# Patient Record
Sex: Female | Born: 1940 | Race: Black or African American | Hispanic: No | Marital: Married | Smoking: Never smoker
Health system: Southern US, Community
[De-identification: ages and names within clinical notes are randomized; demographics above are authoritative.]

## PROBLEM LIST (undated history)

## (undated) DIAGNOSIS — E039 Hypothyroidism, unspecified: Secondary | ICD-10-CM

## (undated) DIAGNOSIS — Z973 Presence of spectacles and contact lenses: Secondary | ICD-10-CM

## (undated) DIAGNOSIS — Z86711 Personal history of pulmonary embolism: Secondary | ICD-10-CM

## (undated) DIAGNOSIS — E785 Hyperlipidemia, unspecified: Secondary | ICD-10-CM

## (undated) DIAGNOSIS — I10 Essential (primary) hypertension: Secondary | ICD-10-CM

## (undated) HISTORY — DX: Essential (primary) hypertension: I10

## (undated) HISTORY — PX: ABDOMINAL HYSTERECTOMY: SHX81

## (undated) HISTORY — PX: BUNIONECTOMY: SHX129

## (undated) HISTORY — DX: Hyperlipidemia, unspecified: E78.5

## (undated) HISTORY — PX: COLONOSCOPY: SHX174

---

## 1998-01-08 ENCOUNTER — Emergency Department (HOSPITAL_COMMUNITY): Admission: EM | Admit: 1998-01-08 | Discharge: 1998-01-08 | Payer: Self-pay | Admitting: Emergency Medicine

## 1998-04-30 ENCOUNTER — Inpatient Hospital Stay (HOSPITAL_COMMUNITY): Admission: RE | Admit: 1998-04-30 | Discharge: 1998-05-03 | Payer: Self-pay | Admitting: Obstetrics and Gynecology

## 1999-12-11 ENCOUNTER — Emergency Department (HOSPITAL_COMMUNITY): Admission: EM | Admit: 1999-12-11 | Discharge: 1999-12-11 | Payer: Self-pay | Admitting: Emergency Medicine

## 1999-12-11 ENCOUNTER — Encounter: Payer: Self-pay | Admitting: Emergency Medicine

## 1999-12-26 ENCOUNTER — Inpatient Hospital Stay (HOSPITAL_COMMUNITY): Admission: EM | Admit: 1999-12-26 | Discharge: 1999-12-31 | Payer: Self-pay | Admitting: Emergency Medicine

## 1999-12-26 ENCOUNTER — Encounter: Payer: Self-pay | Admitting: Emergency Medicine

## 2001-01-05 ENCOUNTER — Encounter: Admission: RE | Admit: 2001-01-05 | Discharge: 2001-01-05 | Payer: Self-pay | Admitting: Obstetrics and Gynecology

## 2001-01-05 ENCOUNTER — Encounter: Payer: Self-pay | Admitting: Obstetrics and Gynecology

## 2002-04-15 ENCOUNTER — Encounter: Payer: Self-pay | Admitting: Obstetrics and Gynecology

## 2002-04-15 ENCOUNTER — Encounter: Admission: RE | Admit: 2002-04-15 | Discharge: 2002-04-15 | Payer: Self-pay | Admitting: Obstetrics and Gynecology

## 2003-05-08 ENCOUNTER — Encounter: Admission: RE | Admit: 2003-05-08 | Discharge: 2003-05-08 | Payer: Self-pay | Admitting: Obstetrics and Gynecology

## 2003-05-08 ENCOUNTER — Encounter: Payer: Self-pay | Admitting: Obstetrics and Gynecology

## 2004-05-20 ENCOUNTER — Ambulatory Visit (HOSPITAL_COMMUNITY): Admission: RE | Admit: 2004-05-20 | Discharge: 2004-05-20 | Payer: Self-pay | Admitting: Obstetrics and Gynecology

## 2004-06-03 ENCOUNTER — Encounter: Admission: RE | Admit: 2004-06-03 | Discharge: 2004-06-03 | Payer: Self-pay | Admitting: Obstetrics and Gynecology

## 2004-12-09 ENCOUNTER — Encounter: Admission: RE | Admit: 2004-12-09 | Discharge: 2004-12-09 | Payer: Self-pay | Admitting: Obstetrics and Gynecology

## 2005-07-25 ENCOUNTER — Encounter: Admission: RE | Admit: 2005-07-25 | Discharge: 2005-07-25 | Payer: Self-pay | Admitting: Internal Medicine

## 2006-01-27 ENCOUNTER — Ambulatory Visit: Payer: Self-pay | Admitting: Internal Medicine

## 2006-02-09 ENCOUNTER — Encounter (INDEPENDENT_AMBULATORY_CARE_PROVIDER_SITE_OTHER): Payer: Self-pay | Admitting: Specialist

## 2006-02-09 ENCOUNTER — Ambulatory Visit: Payer: Self-pay | Admitting: Internal Medicine

## 2006-08-17 ENCOUNTER — Encounter: Admission: RE | Admit: 2006-08-17 | Discharge: 2006-08-17 | Payer: Self-pay | Admitting: Obstetrics and Gynecology

## 2006-08-31 ENCOUNTER — Encounter: Admission: RE | Admit: 2006-08-31 | Discharge: 2006-08-31 | Payer: Self-pay | Admitting: Obstetrics and Gynecology

## 2007-08-21 ENCOUNTER — Encounter: Admission: RE | Admit: 2007-08-21 | Discharge: 2007-08-21 | Payer: Self-pay | Admitting: Internal Medicine

## 2008-04-25 IMAGING — MG MM DIGITAL SCREENING BILAT W/ CAD
4 series · 4 of 4 positions shown · non-contrast
Comparison: none

DG SCREEN MAMMOGRAM BILATERAL
Bilateral CC and MLO view(s) were taken.

DIGITAL SCREENING MAMMOGRAM WITH CAD:
There is a fibroglandular pattern.  A possible mass is noted in the left breast.  Spot compression 
views and possibly sonography are recommended for further evaluation.  In the right breast, no 
masses or malignant type calcifications are identified.  Compared with prior studies.

[R CC]
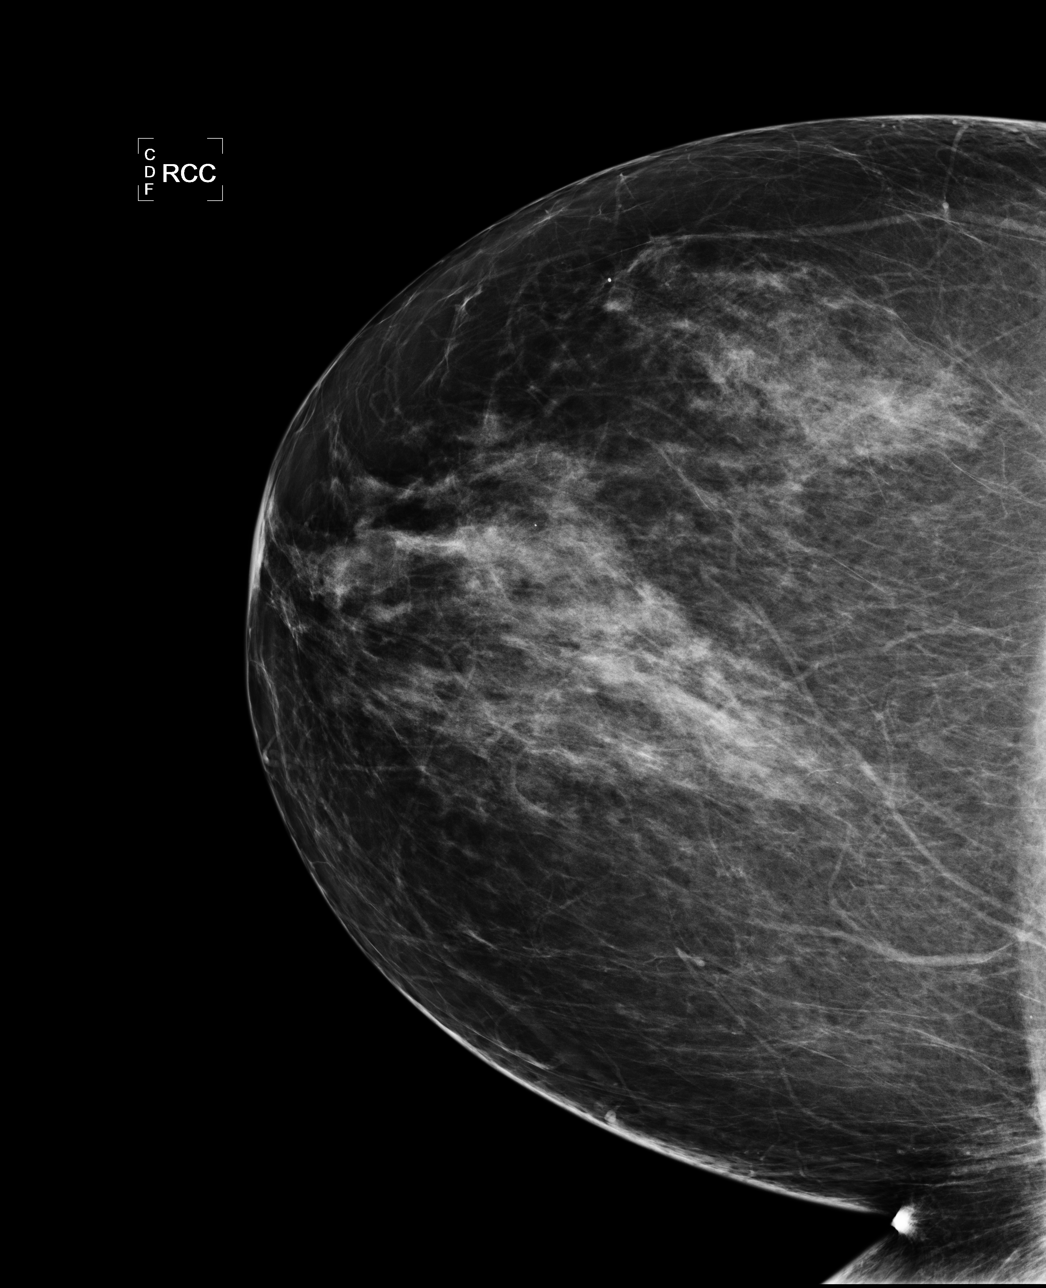

[L CC]
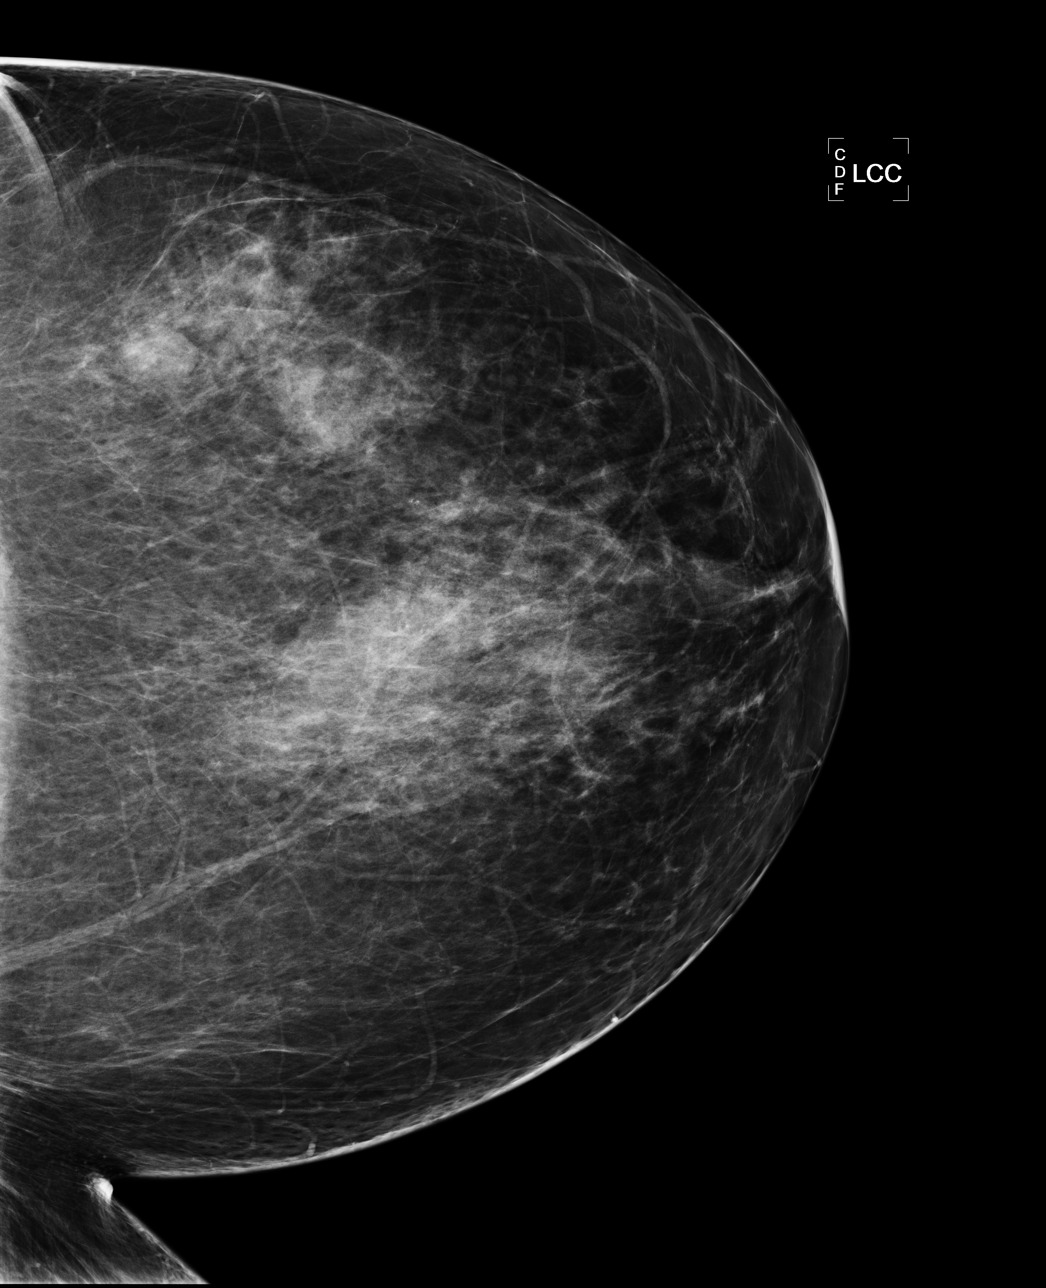

[L MLO]
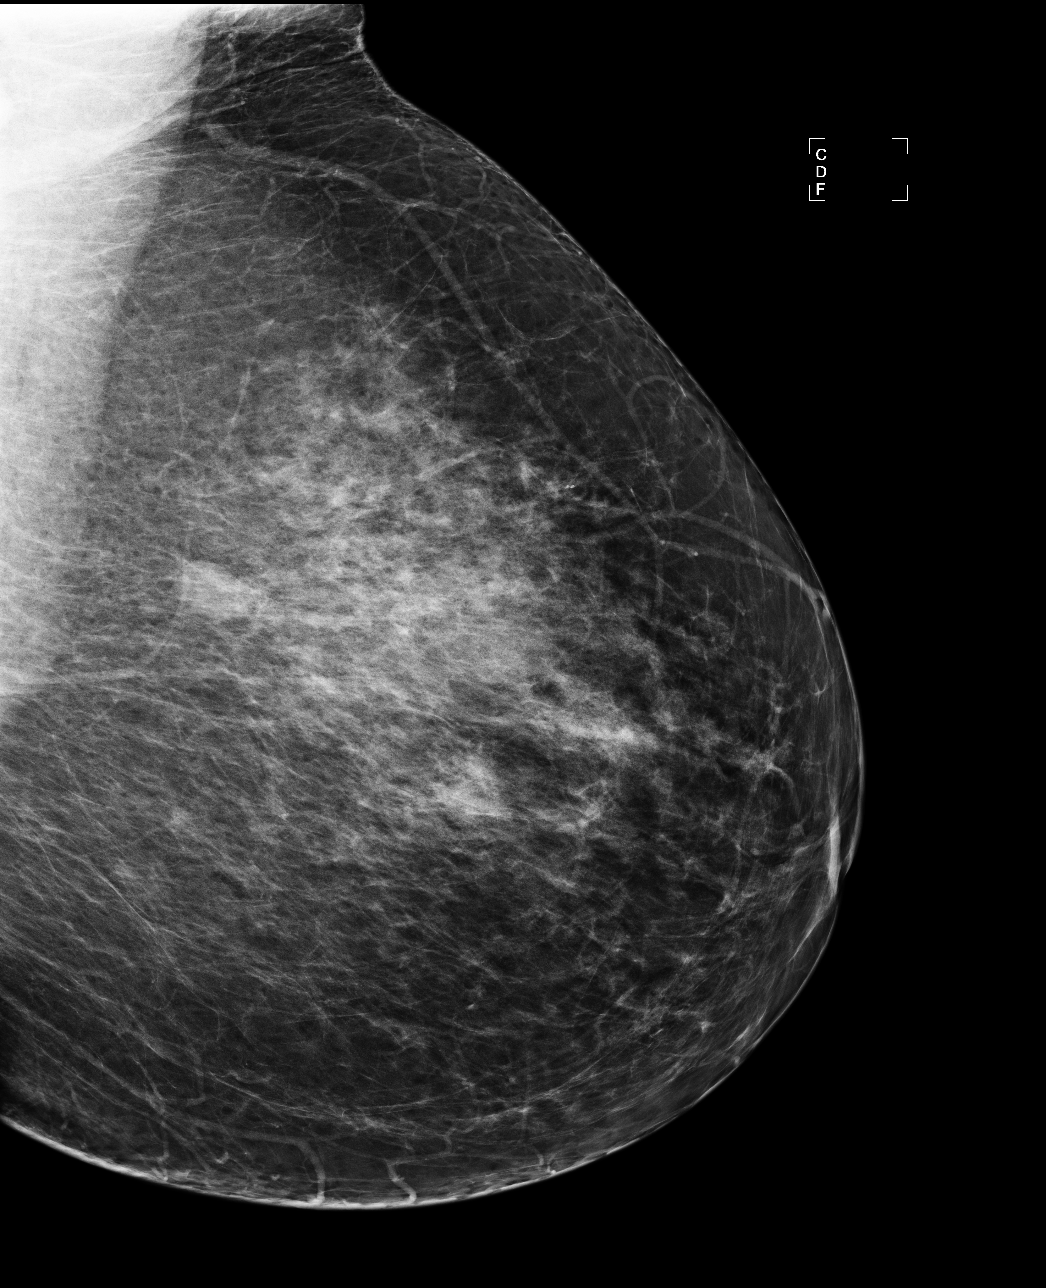

[R MLO]
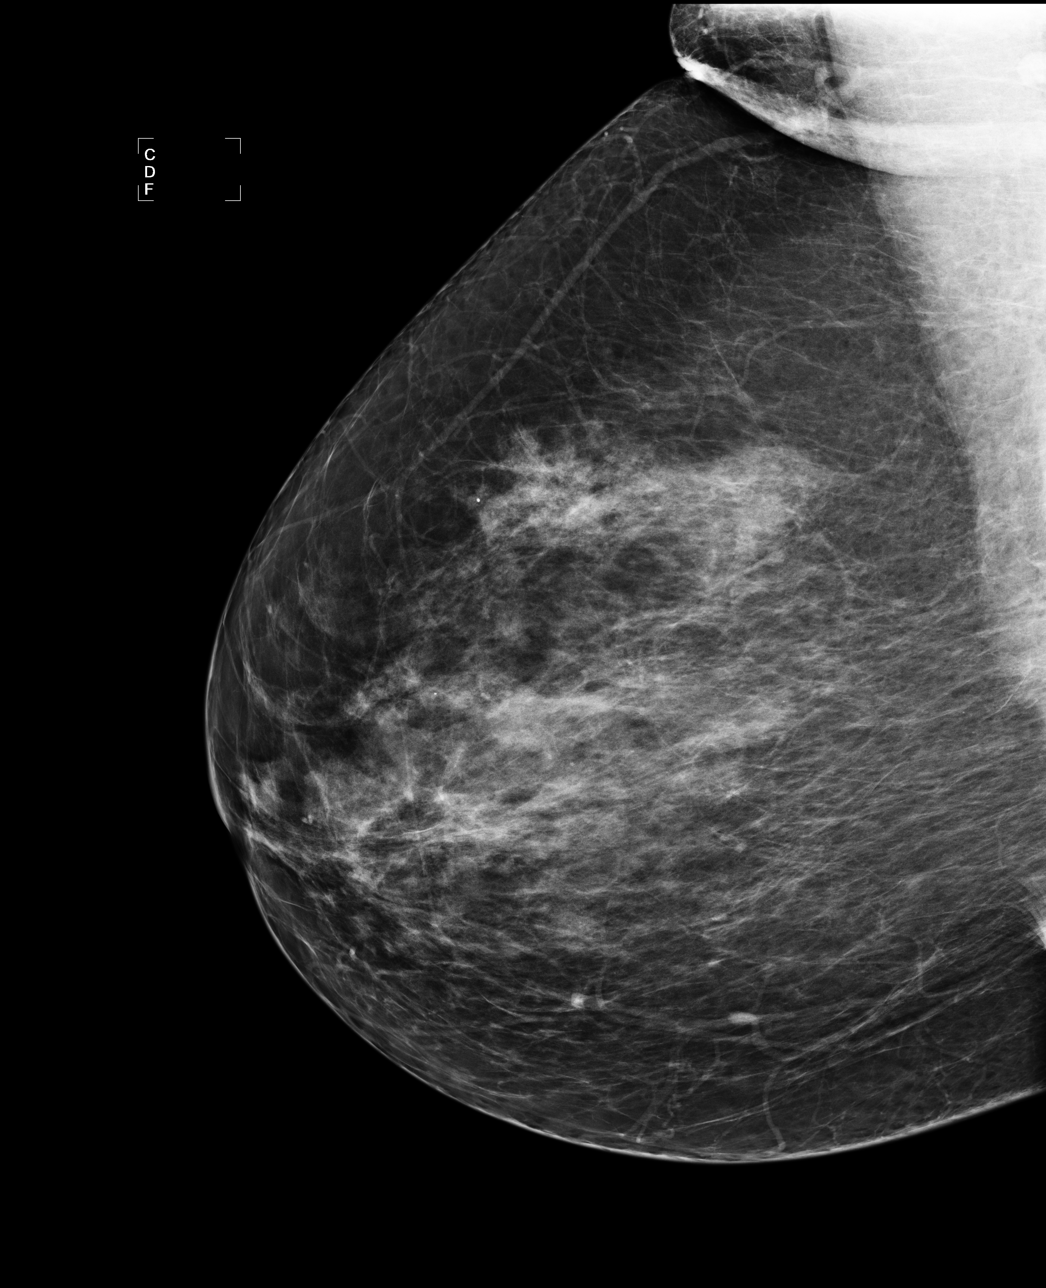

[4 of 4 positions shown; findings below may reference images not displayed]

IMPRESSION: Possible mass, left breast.  Additional evaluation is indicated.  The patient will be contacted for
additional studies and a supplementary report will follow.  No specific mammographic evidence of 
malignancy, right breast.

ASSESSMENT: Need additional imaging evaluation and/or prior mammograms for comparison - BI-RADS 0

Further imaging of the left breast.
ANALYZED BY COMPUTER AIDED DETECTION. , THIS PROCEDURE WAS A DIGITAL MAMMOGRAM.

## 2008-08-21 ENCOUNTER — Encounter: Admission: RE | Admit: 2008-08-21 | Discharge: 2008-08-21 | Payer: Self-pay | Admitting: Internal Medicine

## 2009-01-06 ENCOUNTER — Encounter (INDEPENDENT_AMBULATORY_CARE_PROVIDER_SITE_OTHER): Payer: Self-pay | Admitting: *Deleted

## 2009-01-15 ENCOUNTER — Ambulatory Visit: Payer: Self-pay | Admitting: Internal Medicine

## 2009-01-26 ENCOUNTER — Encounter: Payer: Self-pay | Admitting: Internal Medicine

## 2009-01-26 ENCOUNTER — Telehealth: Payer: Self-pay | Admitting: Internal Medicine

## 2009-01-27 ENCOUNTER — Ambulatory Visit: Payer: Self-pay | Admitting: Internal Medicine

## 2009-08-24 ENCOUNTER — Encounter: Admission: RE | Admit: 2009-08-24 | Discharge: 2009-08-24 | Payer: Self-pay | Admitting: Internal Medicine

## 2010-08-25 ENCOUNTER — Encounter
Admission: RE | Admit: 2010-08-25 | Discharge: 2010-08-25 | Payer: Self-pay | Source: Home / Self Care | Attending: Internal Medicine | Admitting: Internal Medicine

## 2011-01-28 NOTE — Discharge Summary (Signed)
[place]. [facility]  Patient:    Tara Patrick, Tara Patrick                MRN: [id number] Adm. Date:  [id number] Disc. Date: 12/31/99 Attending:  Beatris Ship CC:         Lunette Stands, M.D.                           Discharge Summary  DIAGNOSIS:  Acute pulmonary embolus.  HISTORY OF PRESENT ILLNESS:  Ms. Sanville is a 70 year old black female, pleasant, with no significant past medical history, presenting at this time with sudden onset of syncope at approximately 9 p.m.  She sustained a left fibular fracture, and this has been casted by Dr. Lunette Stands for approximately two weeks.  She has been fairly immobile and has kept it elevated.  Upon arising the evening of presentation, she had a syncopal spell, and husband witnessed it and called EMS.  She presented to the emergency room where, from a cardiac standpoint, she seemed to be fine.  CT of the brain was unremarkable.  CT of the chest confirmed a pulmonary embolus, and she was admitted for anticoagulation.  For details, see the dictated summary on December 26, 1999.  LABORATORY DATA:  Chest x-ray revealed no acute cardiopulmonary disease, and only low lung volumes.  CT scan of the brain revealed no intracranial hemorrhage or infarct.  Spiral CT of the chest revealed filling defects in both main pulmonary arteries with additional filling defects in both lower lobe branches consistent with pulmonary emboli with significant clot burden. The CT of the lower extremities revealed no evidence of DVT.  EKG:  Normal sinus rhythm.  Nonspecific T-wave changes.  CBC:  Hemoglobin 12.4, hematocrit 36.3, with white blood cell count 8.9, platelet count 210,000.  Chemistries:  Sodium 139, potassium 3.7, chloride 104, CO2 28, BUN 17, creatinine 0.9, glucose 118, calcium 9.2, protein 7.2, albumin 3.2, SGOT 46, SGPT 41, bilirubin 0.3.  Urinalysis was negative.  HOSPITAL COURSE:  The patient was admitted, placed on IV  heparin and Coumadin per pharmacy protocol.  The day prior to discharge her INR was just short of therapeutic effectiveness with an INR of 1.7.  She was kept an additional day. From a cardiopulmonary standpoint she remained stable.  At no point did she have any chest pain, shortness of breath, neurologic symptoms, or any further syncope.  Ambulation is limited secondary to the cast, and activity is guided by orthopedic surgery.  Blood pressure remained stable.  She remained afebrile.  The patient is discharged in stable condition.  DISCHARGE MEDICATIONS:  Coumadin, dose to be determined prior to discharge, and not available at the time of this dictation.  FOLLOW-UP:  With Dr. Jacky Kindle in the office in one month.  Pro time is to be checked in Dr. Patty Sermons office in three days, on Monday.  DISCHARGE INSTRUCTIONS:  She was provided a Coumadin diet.  She is to call for any difficulties.  She was instructed on discontinuation of her hormone replacement therapy. DD:  12/31/99 TD:  12/31/99 Job: [id number]

## 2011-01-28 NOTE — H&P (Signed)
[place]. [facility]  Patient:    Tara, Patrick                      MRN: [id number] Adm. Date:  12/26/99 Attending:  Gerlene Burdock A. Jacky Kindle, M.D.                         History and Physical  CHIEF COMPLAINT:  Syncope.  HISTORY OF PRESENT ILLNESS:  Tara Patrick is a 70 year old black female, very pleasant, without significant past medical history, presenting with sudden onset of syncope at approximately 9 p.m.  She has had a left fibula fracture and this has been casted by Dr. Lunette Stands for approximately two weeks.  She has been fairly immobile, with this elevated, but has had very little discomfort.  She got up to go to the bathroom at about 9 p.m. and husband heard her hit the floor.  She was shaking and unresponsive for a few minutes but returned to baseline.  She denies any headache, chest pain, nausea, vomiting, but does relate to some minimal shortness of breath.  She had no significant incontinence and has had no fever,  chills or sweats.  CT scan in the emergency room for evaluation of pulmonary embolus indeed confirmed pulmonary embolus and patient is admitted for anticoagulation.  ALLERGIES:  None.  MEDICATIONS:  "Hormone."  MEDICAL ILLNESSES:  None.  SURGICAL ILLNESSES:  Total abdominal hysterectomy, 1999.  FAMILY HISTORY:  Noncontributory.  SOCIAL HISTORY:  Patient is married and has three children.  She does not smoke or drink.  PHYSICAL EXAMINATION:  VITAL SIGNS:  Temperature 98.  Blood pressure 140/80.  Pulse 90, regular. Respiratory rate equals 18 and unlabored.  GENERAL:  Patient is pleasant, in no distress.  HEENT:  Anicteric.  Pupils are round and reactive.  Oropharynx benign.  NECK:  Supple.  No JVD.  Carotids 2+/4+.  No bruits.  LUNGS:  Clear.  CARDIOVASCULAR:  Regular rate and rhythm.  No murmur.  ABDOMEN:  Soft, nontender.  No hepatosplenomegaly.  BACK:  No CVA or spinal tenderness.  EXTREMITIES:   Extremities reveal calves to be soft, nontender.  Left lower extremity is difficult to examine as she has a cast in place encompassing the foot and distal leg.  Toes appear intact with good distal neurovascular.  Upper part of the cast reveals the calf to be soft and nontender.  Unable to check Homans sign or get a good look but cast is not too tight.  Right lower extremity is normal. Negative Homans sign.  Warm distally.  Good pulses.  NEUROLOGIC:  Exam is nonfocal, grossly.  Gait not assessed.  LABORATORY AND X-RAY FINDINGS:  EKG:  Normal sinus rhythm with some nonspecific ST and T wave changes.  Spiral CT of the chest verbally confirms pulmonary embolus.  ASSESSMENT:  Pulmonary embolus secondary to immobility plus/minus hormonal replacement therapy.  PLAN:  Patient is to be admitted for heparin and Coumadin, with a planned six-month course of anticoagulation.  Hormone therapy will be held at this time. DD:  12/26/99 TD:  12/26/99 Job: [id number]

## 2011-07-28 ENCOUNTER — Other Ambulatory Visit: Payer: Self-pay | Admitting: Internal Medicine

## 2011-07-28 ENCOUNTER — Other Ambulatory Visit (HOSPITAL_COMMUNITY): Payer: Self-pay | Admitting: Internal Medicine

## 2011-07-28 DIAGNOSIS — Z1231 Encounter for screening mammogram for malignant neoplasm of breast: Secondary | ICD-10-CM

## 2011-08-29 ENCOUNTER — Ambulatory Visit
Admission: RE | Admit: 2011-08-29 | Discharge: 2011-08-29 | Disposition: A | Discharge status: Home / Self Care | Payer: Self-pay | Source: Ambulatory Visit | Attending: Internal Medicine | Admitting: Internal Medicine

## 2011-08-29 ENCOUNTER — Ambulatory Visit: Payer: Self-pay

## 2011-08-29 DIAGNOSIS — Z1231 Encounter for screening mammogram for malignant neoplasm of breast: Secondary | ICD-10-CM

## 2012-07-24 ENCOUNTER — Other Ambulatory Visit: Payer: Self-pay | Admitting: Internal Medicine

## 2012-07-24 DIAGNOSIS — Z1231 Encounter for screening mammogram for malignant neoplasm of breast: Secondary | ICD-10-CM

## 2012-08-29 ENCOUNTER — Ambulatory Visit
Admission: RE | Admit: 2012-08-29 | Discharge: 2012-08-29 | Disposition: A | Discharge status: Home / Self Care | Payer: Medicare Other | Source: Ambulatory Visit | Attending: Internal Medicine | Admitting: Internal Medicine

## 2012-08-29 DIAGNOSIS — Z1231 Encounter for screening mammogram for malignant neoplasm of breast: Secondary | ICD-10-CM

## 2013-08-19 ENCOUNTER — Other Ambulatory Visit: Payer: Self-pay

## 2013-08-19 DIAGNOSIS — Z1231 Encounter for screening mammogram for malignant neoplasm of breast: Secondary | ICD-10-CM

## 2013-09-27 ENCOUNTER — Ambulatory Visit
Admission: RE | Admit: 2013-09-27 | Discharge: 2013-09-27 | Disposition: A | Discharge status: Home / Self Care | Payer: Medicare Other | Source: Ambulatory Visit

## 2013-09-27 DIAGNOSIS — Z1231 Encounter for screening mammogram for malignant neoplasm of breast: Secondary | ICD-10-CM

## 2013-10-01 ENCOUNTER — Other Ambulatory Visit: Payer: Self-pay | Admitting: Internal Medicine

## 2013-10-01 DIAGNOSIS — R928 Other abnormal and inconclusive findings on diagnostic imaging of breast: Secondary | ICD-10-CM

## 2013-10-03 ENCOUNTER — Other Ambulatory Visit: Payer: Self-pay

## 2013-10-14 ENCOUNTER — Other Ambulatory Visit: Payer: Self-pay | Admitting: Internal Medicine

## 2013-10-14 ENCOUNTER — Ambulatory Visit
Admission: RE | Admit: 2013-10-14 | Discharge: 2013-10-14 | Disposition: A | Discharge status: Home / Self Care | Payer: Medicare Other | Source: Ambulatory Visit | Attending: Internal Medicine | Admitting: Internal Medicine

## 2013-10-14 DIAGNOSIS — R928 Other abnormal and inconclusive findings on diagnostic imaging of breast: Secondary | ICD-10-CM

## 2013-10-21 ENCOUNTER — Ambulatory Visit
Admission: RE | Admit: 2013-10-21 | Discharge: 2013-10-21 | Disposition: A | Discharge status: Home / Self Care | Payer: Medicare Other | Source: Ambulatory Visit | Attending: Internal Medicine | Admitting: Internal Medicine

## 2013-10-21 DIAGNOSIS — R928 Other abnormal and inconclusive findings on diagnostic imaging of breast: Secondary | ICD-10-CM

## 2013-11-01 ENCOUNTER — Ambulatory Visit (INDEPENDENT_AMBULATORY_CARE_PROVIDER_SITE_OTHER): Payer: Medicare Other | Admitting: General Surgery

## 2013-11-01 ENCOUNTER — Encounter (INDEPENDENT_AMBULATORY_CARE_PROVIDER_SITE_OTHER): Payer: Self-pay | Admitting: General Surgery

## 2013-11-01 VITALS — BP 140/90 | HR 64 | Temp 98.2°F | Resp 14 | Ht 63.5 in | Wt 211.4 lb

## 2013-11-01 DIAGNOSIS — D249 Benign neoplasm of unspecified breast: Secondary | ICD-10-CM

## 2013-11-01 DIAGNOSIS — D242 Benign neoplasm of left breast: Secondary | ICD-10-CM

## 2013-11-01 NOTE — Patient Instructions (Signed)
Your recent left breast needle biopsy shows an intraductal papilloma with calcifications.  There is a low but definite chance there may be early breast cancer associated with this.  You will be scheduled for a left lumpectomy with needle localization and radioactive seed localization in the near future.      Lumpectomy A lumpectomy is a form of "breast conserving" or "breast preservation" surgery. It may also be referred to as a partial mastectomy. During a lumpectomy, the portion of the breast that contains the cancerous tumor or breast mass (the lump) is removed. Some normal tissue around the lump may also be removed to make sure all the tumor has been removed. This surgery should take 40 minutes or less. LET [facility] CARE PROVIDER KNOW ABOUT:  Any allergies you have.  All medicines you are taking, including vitamins, herbs, eye drops, creams, and over-the-counter medicines.  Previous problems you or members of your family have had with the use of anesthetics.  Any blood disorders you have.  Previous surgeries you have had.  Medical conditions you have. RISKS AND COMPLICATIONS Generally, this is a safe procedure. However, as with any procedure, complications can occur. Possible complications include:  Bleeding.  Infection.  Pain.  Temporary swelling.  Change in the shape of the breast, particularly if a large portion is removed. BEFORE THE PROCEDURE  Ask your health care provider about changing or stopping your regular medicines.  Do not eat or drink anything for 7 8 hours before the surgery or as directed by your health care provider. Ask your health care provider if you can take a sip of water with any approved medicines.  On the day of surgery, your healthcare provider will use a mammogram or ultrasound to locate and mark the tumor in your breast. These markings on your breast will show where the cut (incision) will be made. PROCEDURE   An IV tube will be put  into one of your veins.  You may be given medicine to help you relax before the surgery (sedative). You will be given one of the following:  A medicine that numbs the area (local anesthesia).  A medicine that makes you go to sleep (general anesthesia).  Your health care provider will use a kind of electric scalpel that uses heat to minimize bleeding (electrocautery knife).  A curved incision (like a smile or frown) that follows the natural curve of your breast is made, to allow for minimal scarring and better healing.  The tumor will be removed with some of the surrounding tissue. This will be sent to the lab for analysis. Your health care provider may also remove your lymph nodes at this time if needed.  Sometimes, but not always, a rubber tube called a drain will be surgically inserted into your breast area or armpit to collect excess fluid that may accumulate in the space where the tumor was. This drain is connected to a plastic bulb on the outside of your body. This drain creates suction to help remove the fluid.  The incisions will be closed with stitches (sutures).  A bandage may be placed over the incisions. AFTER THE PROCEDURE  You will be taken to the recovery area.  You will be given medicine for pain.  A small rubber drain may be placed in the breast for 2 3 days to prevent a collection of blood (hematoma) from developing in the breast. You will be given instructions on caring for the drain before you go home.  A  pressure bandage (dressing) will be applied for 1 2 days to prevent bleeding. Ask your health care provider how to care for your bandage at home. Document Released: 10/10/2006 Document Revised: 05/01/2013 Document Reviewed: 02/01/2013 [facility] Patient Information 2014 [place].

## 2013-11-01 NOTE — Progress Notes (Addendum)
Patient ID: Tara Patrick, female   DOB: Aug 18, 1941, 73 y.o.   MRN: [id number]  Chief Complaint  Patient presents with  . New Evaluation    Eval Left Br papilloma    HPI Tara Patrick is a 73 y.o. female.  She is referred by Dr. 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  Dr. Burnard Bunting is her PCP.  The patient has no prior history of breast problems. She gets annual mammograms. Recent mammogram shows a focal area of microcalcifications in the left breast, upper inner quadrant, 6 mm diameter. The right breast looked normal. Image guided biopsy shows a sclerosing intraductal papilloma.  She has no breast symptoms. Feels no mass. No nipple discharge or pain.   Comorbidities include hypertension, hyperlipidemia, remote TAH and BSO  History is negative for breast or ovarian cancer. HPI  Past Medical History  Diagnosis Date  . Hypertension   . Hyperlipidemia     Past Surgical History  Procedure Laterality Date  . Abdominal hysterectomy    . Bunionectomy      both feet    History reviewed. No pertinent family history.  Social History History  Substance Use Topics  . Smoking status: Never Smoker   . Smokeless tobacco: Never Used  . Alcohol Use: No    Not on File  Current Outpatient Prescriptions  Medication Sig Dispense Refill  . Black Cohosh 175 MG CAPS Take by mouth.      . calcium carbonate (OS-CAL - DOSED IN MG OF ELEMENTAL CALCIUM) 1250 MG tablet Take 1 tablet by mouth.      . cholecalciferol (VITAMIN D) 1000 UNITS tablet Take 1,000 Units by mouth daily.      Marland Kitchen lisinopril-hydrochlorothiazide (PRINZIDE,ZESTORETIC) 10-12.5 MG per tablet       . pravastatin (PRAVACHOL) 40 MG tablet        No current facility-administered medications for this visit.    Review of Systems Review of Systems  Constitutional: Negative for fever, chills and unexpected weight  change.  HENT: Negative for congestion, hearing loss, sore throat, trouble swallowing and voice change.   Eyes: Negative for visual disturbance.  Respiratory: Negative for cough and wheezing.   Cardiovascular: Negative for chest pain, palpitations and leg swelling.  Gastrointestinal: Negative for nausea, vomiting, abdominal pain, diarrhea, constipation, blood in stool, abdominal distention and anal bleeding.  Genitourinary: Negative for hematuria, vaginal bleeding and difficulty urinating.  Musculoskeletal: Negative for arthralgias.  Skin: Negative for rash and wound.  Neurological: Negative for seizures, syncope and headaches.  Hematological: Negative for adenopathy. Does not bruise/bleed easily.  Psychiatric/Behavioral: Negative for confusion.    Blood pressure 140/90, pulse 64, temperature 98.2 F (36.8 C), temperature source Oral, resp. rate 14, height 5' 3.5" (1.613 m), weight 211 lb 6.4 oz (95.89 kg).  Physical Exam Physical Exam  Constitutional: She is oriented to person, place, and time. She appears well-developed and well-nourished. No distress.  HENT:  Head: Normocephalic and atraumatic.  Nose: Nose normal.  Mouth/Throat: No oropharyngeal exudate.  Eyes: Conjunctivae and EOM are normal. Pupils are equal, round, and reactive to light. Left eye exhibits no discharge. No scleral icterus.  Neck: Neck supple. No JVD present. No tracheal deviation present. No thyromegaly present.  Cardiovascular: Normal rate, regular rhythm, normal heart sounds and intact distal pulses.   No murmur heard. Pulmonary/Chest: Effort normal and breath sounds normal. No respiratory distress. She has no wheezes.  She has no rales. She exhibits no tenderness.  Breasts are moderately large. There is no palpable mass, skin changes, or axillary adenopathy on either side.  Abdominal: Soft. Bowel sounds are normal. She exhibits no distension and no mass. There is no tenderness. There is no rebound and no  guarding.  Well healed Pfannenstiel incision.  Musculoskeletal: She exhibits no edema and no tenderness.  Lymphadenopathy:    She has no cervical adenopathy.  Neurological: She is alert and oriented to person, place, and time. She exhibits normal muscle tone. Coordination normal.  Skin: Skin is warm. No rash noted. She is not diaphoretic. No erythema. No pallor.  Psychiatric: She has a normal mood and affect. Her behavior is normal. Judgment and thought content normal.    Data Reviewed Imaging studies. Pathology report  Assessment    Sclerosing intraductal papilloma left breast, upper inner quadrant, 6 mm diameter. Excisional biopsy is warranted to exclude early breast cancer  Hypertension  Hyperlipidemia  Remote history of TAH and BSO     Plan    The patient was scheduled for left lumpectomy with needle localization and radio seed localization  I've discussed indications, details, techniques, and numerous risks of the surgery with her. She's aware of the risk of bleeding, infection, cosmetic deformity, skin necrosis, reoperation for complications or for cancer, loss of seed, and other unforeseen problems. She understands these issues well. All of her questions are answered. She agrees with this plan.        Edsel Petrin. Dalbert Batman, M.D., [facility] General and Minimally invasive Surgery Breast and Colorectal Surgery Office:   [phone] Pager:   [phone]  11/01/2013, 3:24 PM

## 2013-11-27 ENCOUNTER — Encounter (HOSPITAL_BASED_OUTPATIENT_CLINIC_OR_DEPARTMENT_OTHER): Payer: Self-pay | Admitting: *Deleted

## 2013-11-27 NOTE — Progress Notes (Signed)
To come in for [facility] labs and ekg

## 2013-11-28 ENCOUNTER — Encounter (HOSPITAL_BASED_OUTPATIENT_CLINIC_OR_DEPARTMENT_OTHER)
Admission: RE | Admit: 2013-11-28 | Discharge: 2013-11-28 | Disposition: A | Discharge status: Home / Self Care | Payer: Medicare Other | Source: Ambulatory Visit | Attending: General Surgery | Admitting: General Surgery

## 2013-11-28 ENCOUNTER — Ambulatory Visit
Admission: RE | Admit: 2013-11-28 | Discharge: 2013-11-28 | Disposition: A | Discharge status: Home / Self Care | Payer: Medicare Other | Source: Ambulatory Visit | Attending: General Surgery | Admitting: General Surgery

## 2013-11-28 ENCOUNTER — Other Ambulatory Visit: Payer: Self-pay

## 2013-11-28 ENCOUNTER — Other Ambulatory Visit (INDEPENDENT_AMBULATORY_CARE_PROVIDER_SITE_OTHER): Payer: Self-pay | Admitting: General Surgery

## 2013-11-28 DIAGNOSIS — Z01812 Encounter for preprocedural laboratory examination: Secondary | ICD-10-CM | POA: Insufficient documentation

## 2013-11-28 DIAGNOSIS — D242 Benign neoplasm of left breast: Secondary | ICD-10-CM

## 2013-11-28 DIAGNOSIS — Z01810 Encounter for preprocedural cardiovascular examination: Secondary | ICD-10-CM | POA: Insufficient documentation

## 2013-11-28 LAB — CBC WITH DIFFERENTIAL/PLATELET
BASOS ABS: 0 10*3/uL (ref 0.0–0.1)
Basophils Relative: 0 % (ref 0–1)
Eosinophils Absolute: 0.1 10*3/uL (ref 0.0–0.7)
Eosinophils Relative: 2 % (ref 0–5)
HCT: 40.5 % (ref 36.0–46.0)
Hemoglobin: 13.8 g/dL (ref 12.0–15.0)
LYMPHS ABS: 2.3 10*3/uL (ref 0.7–4.0)
Lymphocytes Relative: 29 % (ref 12–46)
MCH: 29.2 pg (ref 26.0–34.0)
MCHC: 34.1 g/dL (ref 30.0–36.0)
MCV: 85.6 fL (ref 78.0–100.0)
Monocytes Absolute: 0.7 10*3/uL (ref 0.1–1.0)
Monocytes Relative: 8 % (ref 3–12)
NEUTROS PCT: 61 % (ref 43–77)
Neutro Abs: 5 10*3/uL (ref 1.7–7.7)
PLATELETS: 201 10*3/uL (ref 150–400)
RBC: 4.73 MIL/uL (ref 3.87–5.11)
RDW: 13.2 % (ref 11.5–15.5)
WBC: 8.1 10*3/uL (ref 4.0–10.5)

## 2013-11-28 LAB — COMPREHENSIVE METABOLIC PANEL
ALK PHOS: 92 U/L (ref 39–117)
ALT: 12 U/L (ref 0–35)
AST: 18 U/L (ref 0–37)
Albumin: 3.4 g/dL — ABNORMAL LOW (ref 3.5–5.2)
BUN: 14 mg/dL (ref 6–23)
CALCIUM: 9.5 mg/dL (ref 8.4–10.5)
CO2: 28 mEq/L (ref 19–32)
Chloride: 100 mEq/L (ref 96–112)
Creatinine, Ser: 0.75 mg/dL (ref 0.50–1.10)
GFR, EST NON AFRICAN AMERICAN: 84 mL/min — AB (ref >90)
Glucose, Bld: 116 mg/dL — ABNORMAL HIGH (ref 70–99)
Potassium: 4 mEq/L (ref 3.7–5.3)
SODIUM: 140 meq/L (ref 137–147)
Total Bilirubin: 0.4 mg/dL (ref 0.3–1.2)
Total Protein: 7.4 g/dL (ref 6.0–8.3)

## 2013-11-28 NOTE — H&P (Signed)
Tara Patrick   MRN:  [id number]   Description: 73 year old female  Provider: Adin Hector, MD  Department: [facility]           Diagnoses      Papilloma of left breast    -  Primary      217             Reason for Visit      New Evaluation      Eval Left Br papilloma               Current Vitals - Last Recorded      BP Pulse Temp(Src) Resp Ht Wt      140/90 64 98.2 F (36.8 C) (Oral) 14 5' 3.5" (1.613 m) 211 lb 6.4 oz (95.89 kg)      BMI 36.86 kg/m2                        History and Physical     Adin Hector, MD      Status: Signed            Patient ID: Tara Patrick, female   DOB: Aug 03, 1941, 73 y.o.   MRN: [id number]               HPI Tara Patrick is a 73 y.o. female.  She is referred by Dr. 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  Tara Patrick is her PCP.   The patient has no prior history of breast problems. She gets annual mammograms. Recent mammogram shows a focal area of microcalcifications in the left breast, upper inner quadrant, 6 mm diameter. The right breast looked normal. Image guided biopsy shows a sclerosing intraductal papilloma.   She has no breast symptoms. Feels no mass. No nipple discharge or pain.    Comorbidities include hypertension, hyperlipidemia, remote TAH and BSO   History is negative for breast or ovarian cancer.        Past Medical History   Diagnosis  Date   .  Hypertension     .  Hyperlipidemia           Past Surgical History   Procedure  Laterality  Date   .  Abdominal hysterectomy       .  Bunionectomy           both feet        History reviewed. No pertinent family history.   Social History History   Substance Use Topics   .  Smoking status:  Never Smoker    .  Smokeless tobacco:  Never Used   .  Alcohol Use:  No          Current Outpatient  Prescriptions   Medication  Sig  Dispense  Refill   .  Black Cohosh 175 MG CAPS  Take by mouth.         .  calcium carbonate (OS-CAL - DOSED IN MG OF ELEMENTAL CALCIUM) 1250 MG tablet  Take 1 tablet by mouth.         .  cholecalciferol (VITAMIN D) 1000 UNITS tablet  Take 1,000 Units by mouth daily.         Marland Kitchen  lisinopril-hydrochlorothiazide (PRINZIDE,ZESTORETIC) 10-12.5 MG per tablet           .  pravastatin (PRAVACHOL)  40 MG tablet               Review of Systems  Constitutional: Negative for fever, chills and unexpected weight change.  HENT: Negative for congestion, hearing loss, sore throat, trouble swallowing and voice change.   Eyes: Negative for visual disturbance.  Respiratory: Negative for cough and wheezing.   Cardiovascular: Negative for chest pain, palpitations and leg swelling.  Gastrointestinal: Negative for nausea, vomiting, abdominal pain, diarrhea, constipation, blood in stool, abdominal distention and anal bleeding.  Genitourinary: Negative for hematuria, vaginal bleeding and difficulty urinating.  Musculoskeletal: Negative for arthralgias.  Skin: Negative for rash and wound.  Neurological: Negative for seizures, syncope and headaches.  Hematological: Negative for adenopathy. Does not bruise/bleed easily.  Psychiatric/Behavioral: Negative for confusion.      Blood pressure 140/90, pulse 64, temperature 98.2 F (36.8 C), temperature source Oral, resp. rate 14, height 5' 3.5" (1.613 m), weight 211 lb 6.4 oz (95.89 kg).   Physical Exam  Constitutional: She is oriented to person, place, and time. She appears well-developed and well-nourished. No distress.  HENT:   Head: Normocephalic and atraumatic.   Nose: Nose normal.   Mouth/Throat: No oropharyngeal exudate.  Eyes: Conjunctivae and EOM are normal. Pupils are equal, round, and reactive to light. Left eye exhibits no discharge. No scleral icterus.  Neck: Neck supple. No JVD present. No tracheal deviation  present. No thyromegaly present.  Cardiovascular: Normal rate, regular rhythm, normal heart sounds and intact distal pulses.    No murmur heard. Pulmonary/Chest: Effort normal and breath sounds normal. No respiratory distress. She has no wheezes. She has no rales. She exhibits no tenderness.  Breasts are moderately large. There is no palpable mass, skin changes, or axillary adenopathy on either side.  Abdominal: Soft. Bowel sounds are normal. She exhibits no distension and no mass. There is no tenderness. There is no rebound and no guarding.  Well healed Pfannenstiel incision.  Musculoskeletal: She exhibits no edema and no tenderness.  Lymphadenopathy:    She has no cervical adenopathy.  Neurological: She is alert and oriented to person, place, and time. She exhibits normal muscle tone. Coordination normal.  Skin: Skin is warm. No rash noted. She is not diaphoretic. No erythema. No pallor.  Psychiatric: She has a normal mood and affect. Her behavior is normal. Judgment and thought content normal.      Data Reviewed Imaging studies. Pathology report   Assessment    Sclerosing intraductal papilloma left breast, upper inner quadrant, 6 mm diameter. Excisional biopsy is warranted to exclude early breast cancer   Hypertension   Hyperlipidemia   Remote history of TAH and BSO      Plan    The patient was scheduled for left lumpectomy with needle localization and radio seed localization   I've discussed indications, details, techniques, and numerous risks of the surgery with her. She's aware of the risk of bleeding, infection, cosmetic deformity, skin necrosis, reoperation for complications or for cancer, loss of seed, and other unforeseen problems. She understands these issues well. All of her questions are answered. She agrees with this plan.         Edsel Petrin. Dalbert Batman, M.D., [facility] General and Minimally invasive Surgery Breast and Colorectal  Surgery Office:   [phone] Pager:   [phone]

## 2013-12-02 ENCOUNTER — Encounter (HOSPITAL_BASED_OUTPATIENT_CLINIC_OR_DEPARTMENT_OTHER)
Admission: RE | Disposition: A | Discharge status: Home / Self Care | Payer: Self-pay | Source: Ambulatory Visit | Attending: General Surgery

## 2013-12-02 ENCOUNTER — Ambulatory Visit (HOSPITAL_BASED_OUTPATIENT_CLINIC_OR_DEPARTMENT_OTHER)
Admission: RE | Admit: 2013-12-02 | Discharge: 2013-12-02 | Disposition: A | Discharge status: Home / Self Care | Payer: Medicare Other | Source: Ambulatory Visit | Attending: General Surgery | Admitting: General Surgery

## 2013-12-02 ENCOUNTER — Encounter (HOSPITAL_BASED_OUTPATIENT_CLINIC_OR_DEPARTMENT_OTHER): Payer: Self-pay | Admitting: *Deleted

## 2013-12-02 ENCOUNTER — Encounter (HOSPITAL_BASED_OUTPATIENT_CLINIC_OR_DEPARTMENT_OTHER): Payer: Medicare Other | Admitting: Certified Registered"

## 2013-12-02 ENCOUNTER — Ambulatory Visit (HOSPITAL_BASED_OUTPATIENT_CLINIC_OR_DEPARTMENT_OTHER): Payer: Medicare Other | Admitting: Certified Registered"

## 2013-12-02 ENCOUNTER — Ambulatory Visit
Admission: RE | Admit: 2013-12-02 | Discharge: 2013-12-02 | Disposition: A | Discharge status: Home / Self Care | Payer: Medicare Other | Source: Ambulatory Visit | Attending: General Surgery | Admitting: General Surgery

## 2013-12-02 DIAGNOSIS — D242 Benign neoplasm of left breast: Secondary | ICD-10-CM | POA: Diagnosis present

## 2013-12-02 DIAGNOSIS — D249 Benign neoplasm of unspecified breast: Secondary | ICD-10-CM | POA: Insufficient documentation

## 2013-12-02 DIAGNOSIS — E785 Hyperlipidemia, unspecified: Secondary | ICD-10-CM | POA: Insufficient documentation

## 2013-12-02 DIAGNOSIS — E039 Hypothyroidism, unspecified: Secondary | ICD-10-CM | POA: Insufficient documentation

## 2013-12-02 DIAGNOSIS — I10 Essential (primary) hypertension: Secondary | ICD-10-CM | POA: Insufficient documentation

## 2013-12-02 DIAGNOSIS — E669 Obesity, unspecified: Secondary | ICD-10-CM | POA: Insufficient documentation

## 2013-12-02 HISTORY — DX: Personal history of pulmonary embolism: Z86.711

## 2013-12-02 HISTORY — PX: BREAST LUMPECTOMY WITH RADIOACTIVE SEED LOCALIZATION: SHX6424

## 2013-12-02 HISTORY — DX: Presence of spectacles and contact lenses: Z97.3

## 2013-12-02 HISTORY — DX: Hypothyroidism, unspecified: E03.9

## 2013-12-02 HISTORY — PX: BREAST EXCISIONAL BIOPSY: SUR124

## 2013-12-02 SURGERY — BREAST LUMPECTOMY WITH RADIOACTIVE SEED LOCALIZATION
Anesthesia: General | Site: Breast | Laterality: Left

## 2013-12-02 MED ORDER — FENTANYL CITRATE 0.05 MG/ML IJ SOLN: 50.0000 ug | INTRAMUSCULAR | Status: DC | PRN

## 2013-12-02 MED ORDER — FENTANYL CITRATE 0.05 MG/ML IJ SOLN
INTRAMUSCULAR | Status: DC | PRN
  Administered 2013-12-02: 25 ug via INTRAVENOUS
  Administered 2013-12-02: 50 ug via INTRAVENOUS
  Administered 2013-12-02 (×2): 25 ug via INTRAVENOUS

## 2013-12-02 MED ORDER — CHLORHEXIDINE GLUCONATE 4 % EX LIQD: 1.0000 "application " | Freq: Once | CUTANEOUS | Status: DC

## 2013-12-02 MED ORDER — DEXAMETHASONE SODIUM PHOSPHATE 4 MG/ML IJ SOLN
INTRAMUSCULAR | Status: DC | PRN
  Administered 2013-12-02: 6 mg via INTRAVENOUS

## 2013-12-02 MED ORDER — BUPIVACAINE-EPINEPHRINE PF 0.5-1:200000 % IJ SOLN
INTRAMUSCULAR | Status: AC
  Filled 2013-12-02: qty 30

## 2013-12-02 MED ORDER — CEFAZOLIN SODIUM-DEXTROSE 2-3 GM-% IV SOLR
2.0000 g | INTRAVENOUS | Status: AC
  Administered 2013-12-02: 2 g via INTRAVENOUS

## 2013-12-02 MED ORDER — OXYCODONE HCL 5 MG/5ML PO SOLN: 5.0000 mg | Freq: Once | ORAL | Status: DC | PRN

## 2013-12-02 MED ORDER — EPHEDRINE SULFATE 50 MG/ML IJ SOLN
INTRAMUSCULAR | Status: DC | PRN
  Administered 2013-12-02: 10 mg via INTRAVENOUS

## 2013-12-02 MED ORDER — LACTATED RINGERS IV SOLN
INTRAVENOUS | Status: DC
  Administered 2013-12-02: 07:00:00 via INTRAVENOUS

## 2013-12-02 MED ORDER — HYDROCODONE-ACETAMINOPHEN 5-325 MG PO TABS: 1.0000 | ORAL_TABLET | ORAL | Status: DC | PRN

## 2013-12-02 MED ORDER — PROPOFOL 10 MG/ML IV EMUL
INTRAVENOUS | Status: AC
  Filled 2013-12-02: qty 100

## 2013-12-02 MED ORDER — CEFAZOLIN SODIUM-DEXTROSE 2-3 GM-% IV SOLR
INTRAVENOUS | Status: AC
  Filled 2013-12-02: qty 50

## 2013-12-02 MED ORDER — FENTANYL CITRATE 0.05 MG/ML IJ SOLN
INTRAMUSCULAR | Status: AC
  Filled 2013-12-02: qty 6

## 2013-12-02 MED ORDER — OXYCODONE HCL 5 MG PO TABS: 5.0000 mg | ORAL_TABLET | Freq: Once | ORAL | Status: DC | PRN

## 2013-12-02 MED ORDER — ONDANSETRON HCL 4 MG/2ML IJ SOLN: 4.0000 mg | Freq: Four times a day (QID) | INTRAMUSCULAR | Status: DC | PRN

## 2013-12-02 MED ORDER — MIDAZOLAM HCL 2 MG/2ML IJ SOLN: 1.0000 mg | INTRAMUSCULAR | Status: DC | PRN

## 2013-12-02 MED ORDER — BUPIVACAINE-EPINEPHRINE 0.5-1:200000 % IJ SOLN
INTRAMUSCULAR | Status: DC | PRN
Start: 2013-12-02 — End: 2013-12-02
  Administered 2013-12-02: 18.5 mL via INTRAPLEURAL

## 2013-12-02 MED ORDER — LIDOCAINE HCL (CARDIAC) 20 MG/ML IV SOLN
INTRAVENOUS | Status: DC | PRN
  Administered 2013-12-02: 60 mg via INTRAVENOUS

## 2013-12-02 MED ORDER — PROPOFOL 10 MG/ML IV BOLUS
INTRAVENOUS | Status: DC | PRN
  Administered 2013-12-02: 150 mg via INTRAVENOUS

## 2013-12-02 MED ORDER — ONDANSETRON HCL 4 MG/2ML IJ SOLN
INTRAMUSCULAR | Status: DC | PRN
  Administered 2013-12-02: 4 mg via INTRAVENOUS

## 2013-12-02 MED ORDER — FENTANYL CITRATE 0.05 MG/ML IJ SOLN: 25.0000 ug | INTRAMUSCULAR | Status: DC | PRN

## 2013-12-02 SURGICAL SUPPLY — 65 items
APL SKNCLS STERI-STRIP NONHPOA (GAUZE/BANDAGES/DRESSINGS)
APPLIER CLIP 9.375 MED OPEN (MISCELLANEOUS) ×3
BENZOIN TINCTURE PRP APPL 2/3 (GAUZE/BANDAGES/DRESSINGS) IMPLANT
BINDER BREAST LRG (GAUZE/BANDAGES/DRESSINGS) IMPLANT
BINDER BREAST MEDIUM (GAUZE/BANDAGES/DRESSINGS) IMPLANT
BINDER BREAST XLRG (GAUZE/BANDAGES/DRESSINGS) IMPLANT
BINDER BREAST XXLRG (GAUZE/BANDAGES/DRESSINGS) ×3 IMPLANT
BLADE HEX COATED 2.75 (ELECTRODE) ×3 IMPLANT
BLADE SURG 10 STRL SS (BLADE) IMPLANT
BLADE SURG 15 STRL LF DISP TIS (BLADE) ×1 IMPLANT
BLADE SURG 15 STRL SS (BLADE) ×2
CANISTER SUC SOCK COL 7IN (MISCELLANEOUS) ×3 IMPLANT
CANISTER SUCT 1200ML W/VALVE (MISCELLANEOUS) ×3 IMPLANT
CHLORAPREP W/TINT 26ML (MISCELLANEOUS) ×3 IMPLANT
CLIP APPLIE 9.375 MED OPEN (MISCELLANEOUS) ×1 IMPLANT
CLOSURE WOUND 1/2 X4 (GAUZE/BANDAGES/DRESSINGS) ×1
COVER MAYO STAND STRL (DRAPES) ×3 IMPLANT
COVER PROBE W GEL 5X96 (DRAPES) ×3 IMPLANT
COVER TABLE BACK 60X90 (DRAPES) ×3 IMPLANT
DECANTER SPIKE VIAL GLASS SM (MISCELLANEOUS) IMPLANT
DERMABOND ADVANCED (GAUZE/BANDAGES/DRESSINGS) ×2
DERMABOND ADVANCED .7 DNX12 (GAUZE/BANDAGES/DRESSINGS) ×1 IMPLANT
DEVICE DUBIN W/COMP PLATE 8390 (MISCELLANEOUS) ×3 IMPLANT
DRAIN CHANNEL 19F RND (DRAIN) IMPLANT
DRAPE LAPAROSCOPIC ABDOMINAL (DRAPES) ×3 IMPLANT
DRAPE UTILITY XL STRL (DRAPES) ×3 IMPLANT
DRSG PAD ABDOMINAL 8X10 ST (GAUZE/BANDAGES/DRESSINGS) ×3 IMPLANT
ELECT REM PT RETURN 9FT ADLT (ELECTROSURGICAL) ×3
ELECTRODE REM PT RTRN 9FT ADLT (ELECTROSURGICAL) ×1 IMPLANT
EVACUATOR SILICONE 100CC (DRAIN) IMPLANT
GLOVE BIOGEL M 7.0 STRL (GLOVE) ×3 IMPLANT
GLOVE BIOGEL PI IND STRL 7.5 (GLOVE) ×1 IMPLANT
GLOVE BIOGEL PI INDICATOR 7.5 (GLOVE) ×2
GLOVE EUDERMIC 7 POWDERFREE (GLOVE) ×6 IMPLANT
GLOVE EXAM NITRILE PF MED BLUE (GLOVE) ×3 IMPLANT
GOWN STRL REUS W/ TWL LRG LVL3 (GOWN DISPOSABLE) ×1 IMPLANT
GOWN STRL REUS W/ TWL XL LVL3 (GOWN DISPOSABLE) ×1 IMPLANT
GOWN STRL REUS W/TWL LRG LVL3 (GOWN DISPOSABLE) ×2
GOWN STRL REUS W/TWL XL LVL3 (GOWN DISPOSABLE) ×3
KIT MARKER MARGIN INK (KITS) ×3 IMPLANT
NEEDLE HYPO 25X1 1.5 SAFETY (NEEDLE) ×3 IMPLANT
NS IRRIG 1000ML POUR BTL (IV SOLUTION) ×3 IMPLANT
PACK BASIN DAY SURGERY FS (CUSTOM PROCEDURE TRAY) ×3 IMPLANT
PENCIL BUTTON HOLSTER BLD 10FT (ELECTRODE) ×3 IMPLANT
PIN SAFETY STERILE (MISCELLANEOUS) IMPLANT
SHEET MEDIUM DRAPE 40X70 STRL (DRAPES) IMPLANT
SLEEVE SCD COMPRESS KNEE MED (MISCELLANEOUS) ×3 IMPLANT
SPONGE GAUZE 4X4 12PLY STER LF (GAUZE/BANDAGES/DRESSINGS) ×3 IMPLANT
SPONGE LAP 18X18 X RAY DECT (DISPOSABLE) IMPLANT
SPONGE LAP 4X18 X RAY DECT (DISPOSABLE) ×3 IMPLANT
STRIP CLOSURE SKIN 1/2X4 (GAUZE/BANDAGES/DRESSINGS) ×2 IMPLANT
SUT ETHILON 3 0 FSL (SUTURE) IMPLANT
SUT MNCRL AB 4-0 PS2 18 (SUTURE) ×3 IMPLANT
SUT SILK 2 0 SH (SUTURE) ×3 IMPLANT
SUT VIC AB 2-0 CT1 27 (SUTURE)
SUT VIC AB 2-0 CT1 TAPERPNT 27 (SUTURE) IMPLANT
SUT VIC AB 3-0 SH 27 (SUTURE)
SUT VIC AB 3-0 SH 27X BRD (SUTURE) IMPLANT
SUT VICRYL 3-0 CR8 SH (SUTURE) ×3 IMPLANT
SYR CONTROL 10ML LL (SYRINGE) ×3 IMPLANT
TOWEL OR 17X24 6PK STRL BLUE (TOWEL DISPOSABLE) ×3 IMPLANT
TOWEL OR NON WOVEN STRL DISP B (DISPOSABLE) ×3 IMPLANT
TUBE CONNECTING 20'X1/4 (TUBING) ×1
TUBE CONNECTING 20X1/4 (TUBING) ×2 IMPLANT
YANKAUER SUCT BULB TIP NO VENT (SUCTIONS) ×3 IMPLANT

## 2013-12-02 NOTE — Anesthesia Procedure Notes (Signed)
Procedure Name: LMA Insertion Date/Time: 12/02/2013 7:23 AM Performed by: Treylin Burtch Pre-anesthesia Checklist: Patient identified, Emergency Drugs available, Suction available and Patient being monitored Patient Re-evaluated:Patient Re-evaluated prior to inductionOxygen Delivery Method: Circle System Utilized Preoxygenation: Pre-oxygenation with 100% oxygen Intubation Type: IV induction Ventilation: Mask ventilation without difficulty LMA: LMA inserted LMA Size: 4.0 Number of attempts: 1 Airway Equipment and Method: bite block Placement Confirmation: positive ETCO2 Tube secured with: Tape Dental Injury: Teeth and Oropharynx as per pre-operative assessment

## 2013-12-02 NOTE — Op Note (Signed)
Patient Name:           Tara Patrick   Date of Surgery:        12/02/2013  Pre op Diagnosis:      Sclerosing intraductal papilloma left breast, upper inner quadrant  Post op Diagnosis:    Same  Procedure:                 Left lumpectomy with radioactive seed localization and margin assessment  Surgeon:                     Edsel Petrin. Dalbert Batman, M.D., FACS  Assistant:                      None  Operative Indications:   Tara Patrick is a 73 y.o. female. She is referred by Dr. Miquel Dunn at the [facility] for evaluation and surgical management of a sclerosing papilloma of the left breast with calcifications, upper inner quadrant. Dr. Burnard Bunting is her PCP.  The patient has no prior history of breast problems. She gets annual mammograms. Recent mammogram shows a focal area of microcalcifications in the left breast, upper inner quadrant, 6 mm diameter. The right breast looked normal. Image guided biopsy shows a sclerosing intraductal papilloma.  She has no breast symptoms. Feels no mass. No nipple discharge or pain. Examination reveals a very large breasts, no palpable mass or adenopathy. History is negative for breast or ovarian cancer.   Operative Findings:       There was a marker clip and a single seed in the left breast, upper inner quadrant, just peripheral to the areolar margin. Specimen mammogram demonstrated a single radioactive seed immediately adjacent to the previously placed marker clip. The radioactivity was completely contained within the lumpectomy specimen. There was no residual radioactivity of the breast.  Procedure in Detail:          The patient underwent placement of a radioactive seed a few days ago. She was brought to [facility] center and taken to the operating room. Neoprobe confirmed radioactivity within the left breast, upper inner quadrant. The left breast was prepped and draped in a sterile fashion. Surgical time out was performed. Intravenous antibiotics were given. 0.5%  Marcaine with epinephrine was used as local infiltration anesthetic.      Using the neoprobe I isolated the area of greatest radioactivity about 1 cm outside the areolar margin at about the 10:30 position. I marked a curvilinear incision and then made an incision with a knife. Dissection was carried down using cautery into  the breast tissue. Using the neoprobe to guide the dissection I excised the area of radioactivity. The specimen was marked with a 6 color ink kit and silk sutures to orient the pathologist. After removing the specimen I confirmed that all the radioactivity was contained within the lumpectomy specimen and that there was no residual  radioactivity within the breast. Specimen mammogram looked good with the marker clip and the single radioactive seed in the specimen. Specimen was marked with needles and pins and sent to the pathology lab. The lumpectomy cavity looked good. Hemostasis was excellent and achieved with electrocautery. Wound irrigated with saline. The deeper breast tissues were closed with interrupted sutures of 3-0 Vicryl and the skin closed with a running subcuticular suture of 4-0 Monocryl and Dermabond. Breast binder was placed and the patient taken to PACU in stable condition. EBL 10 cc. Counts correct. Complications none.  Edsel Petrin. Dalbert Batman, M.D., FACS General and Minimally Invasive Surgery Breast and Colorectal Surgery  12/02/2013 8:15 AM

## 2013-12-02 NOTE — Discharge Instructions (Signed)
[facility] °Office Phone Number [phone] ° °BREAST BIOPSY/ PARTIAL MASTECTOMY: POST OP INSTRUCTIONS ° °Always review your discharge instruction sheet given to you by the facility where your surgery was performed. ° °IF YOU HAVE DISABILITY OR FAMILY LEAVE FORMS, YOU MUST BRING THEM TO THE OFFICE FOR PROCESSING.  DO NOT GIVE THEM TO YOUR DOCTOR. ° °1. A prescription for pain medication may be given to you upon discharge.  Take your pain medication as prescribed, if needed.  If narcotic pain medicine is not needed, then you may take acetaminophen (Tylenol) or ibuprofen (Advil) as needed. °2. Take your usually prescribed medications unless otherwise directed °3. If you need a refill on your pain medication, please contact your pharmacy.  They will contact our office to request authorization.  Prescriptions will not be filled after 5pm or on week-ends. °4. You should eat very light the first 24 hours after surgery, such as soup, crackers, pudding, etc.  Resume your normal diet the day after surgery. °5. Most patients will experience some swelling and bruising in the breast.  Ice packs and a good support bra will help.  Swelling and bruising can take several days to resolve.  °6. It is common to experience some constipation if taking pain medication after surgery.  Increasing fluid intake and taking a stool softener will usually help or prevent this problem from occurring.  A mild laxative (Milk of Magnesia or Miralax) should be taken according to package directions if there are no bowel movements after 48 hours. °7. Unless discharge instructions indicate otherwise, you may remove your bandages 24-48 hours after surgery, and you may shower at that time.  You may have steri-strips (small skin tapes) in place directly over the incision.  These strips should be left on the skin for 7-10 days.  If your surgeon used skin glue on the incision, you may shower in 24 hours.  The glue will flake off over the  next 2-3 weeks.  Any sutures or staples will be removed at the office during your follow-up visit. °8. ACTIVITIES:  You may resume regular daily activities (gradually increasing) beginning the next day.  Wearing a good support bra or sports bra minimizes pain and swelling.  You may have sexual intercourse when it is comfortable. °a. You may drive when you no longer are taking prescription pain medication, you can comfortably wear a seatbelt, and you can safely maneuver your car and apply brakes. °b. RETURN TO WORK:  ______________________________________________________________________________________ °9. You should see your doctor in the office for a follow-up appointment approximately two weeks after your surgery.  Your doctor’s nurse will typically make your follow-up appointment when she calls you with your pathology report.  Expect your pathology report 2-3 business days after your surgery.  You may call to check if you do not hear from us after three days. °10. OTHER INSTRUCTIONS: _______________________________________________________________________________________________ _____________________________________________________________________________________________________________________________________ °_____________________________________________________________________________________________________________________________________ °_____________________________________________________________________________________________________________________________________ ° °WHEN TO CALL YOUR DOCTOR: °1. Fever over 101.0 °2. Nausea and/or vomiting. °3. Extreme swelling or bruising. °4. Continued bleeding from incision. °5. Increased pain, redness, or drainage from the incision. ° °The clinic staff is available to answer your questions during regular business hours.  Please don’t hesitate to call and ask to speak to one of the nurses for clinical concerns.  If you have a medical emergency, go to the nearest  emergency room or call 911.  A surgeon from [facility] is always on call at the hospital. ° °For further questions, please visit [email or web address]  ° ° °  Post Anesthesia Home Care Instructions ° °Activity: °Get plenty of rest for the remainder of the day. A responsible adult should stay with you for 24 hours following the procedure.  °For the next 24 hours, DO NOT: °-Drive a car °-Operate machinery °-Drink alcoholic beverages °-Take any medication unless instructed by your physician °-Make any legal decisions or sign important papers. ° °Meals: °Start with liquid foods such as gelatin or soup. Progress to regular foods as tolerated. Avoid greasy, spicy, heavy foods. If nausea and/or vomiting occur, drink only clear liquids until the nausea and/or vomiting subsides. Call your physician if vomiting continues. ° °Special Instructions/Symptoms: °Your throat may feel dry or sore from the anesthesia or the breathing tube placed in your throat during surgery. If this causes discomfort, gargle with warm salt water. The discomfort should disappear within 24 hours. ° °

## 2013-12-02 NOTE — Anesthesia Preprocedure Evaluation (Addendum)
Anesthesia Evaluation  Patient identified by MRN, date of birth, ID band Patient awake    Reviewed: Allergy & Precautions, H&P , NPO status , Patient's Chart, lab work & pertinent test results  Airway Mallampati: II  Neck ROM: full    Dental   Pulmonary          Cardiovascular hypertension, Pt. on medications     Neuro/Psych    GI/Hepatic   Endo/Other  Hypothyroidism obese  Renal/GU      Musculoskeletal   Abdominal   Peds  Hematology   Anesthesia Other Findings   Reproductive/Obstetrics                          Anesthesia Physical Anesthesia Plan  ASA: II  Anesthesia Plan: General   Post-op Pain Management:    Induction: Intravenous  Airway Management Planned: LMA  Additional Equipment:   Intra-op Plan:   Post-operative Plan:   Informed Consent: I have reviewed the patients History and Physical, chart, labs and discussed the procedure including the risks, benefits and alternatives for the proposed anesthesia with the patient or authorized representative who has indicated his/her understanding and acceptance.     Plan Discussed with: CRNA, Anesthesiologist and Surgeon  Anesthesia Plan Comments:         Anesthesia Quick Evaluation

## 2013-12-02 NOTE — Transfer of Care (Signed)
Immediate Anesthesia Transfer of Care Note  Patient: Tara Patrick  Procedure(s) Performed: Procedure(s): LEFT BREAST LUMPECTOMY WITH RADIOACTIVE SEED LOCALIZATION (Left)  Patient Location: PACU  Anesthesia Type:General  Level of Consciousness: awake and patient cooperative  Airway & Oxygen Therapy: Patient Spontanous Breathing and Patient connected to face mask oxygen  Post-op Assessment: Report given to PACU RN and Post -op Vital signs reviewed and stable  Post vital signs: Reviewed and stable  Complications: No apparent anesthesia complications

## 2013-12-02 NOTE — Interval H&P Note (Signed)
History and Physical Interval Note:  12/02/2013 7:00 AM  Tara Patrick  has presented today for surgery, with the diagnosis of left breast papalloma  The goals and the various methods of treatment have been discussed with the patient and family. After consideration of risks, benefits and other options for treatment, the patient has consented to  Procedure(s): LEFT BREAST LUMPECTOMY WITH RADIOACTIVE SEED LOCALIZATION (Left) as a surgical intervention .  The patient's history has been reviewed, patient examined today, no change in status, stable for surgery.  I have reviewed the patient's chart and labs.  Questions were answered to the patient's satisfaction.     Adin Hector

## 2013-12-02 NOTE — Anesthesia Postprocedure Evaluation (Signed)
Anesthesia Post Note  Patient: Tara Patrick  Procedure(s) Performed: Procedure(s) (LRB): LEFT BREAST LUMPECTOMY WITH RADIOACTIVE SEED LOCALIZATION (Left)  Anesthesia type: General  Patient location: PACU  Post pain: Pain level controlled and Adequate analgesia  Post assessment: Post-op Vital signs reviewed, Patient's Cardiovascular Status Stable, Respiratory Function Stable, Patent Airway and Pain level controlled  Last Vitals:  Filed Vitals:   12/02/13 0845  BP: 129/61  Pulse: 67  Temp:   Resp: 17    Post vital signs: Reviewed and stable  Level of consciousness: awake, alert  and oriented  Complications: No apparent anesthesia complications

## 2013-12-03 ENCOUNTER — Encounter (HOSPITAL_BASED_OUTPATIENT_CLINIC_OR_DEPARTMENT_OTHER): Payer: Self-pay | Admitting: General Surgery

## 2013-12-03 NOTE — Progress Notes (Signed)
Quick Note:  Inform patient of Pathology report,.Tell her this is a benign intraductal papilloma. There is no evidence of cancer. This is good news. I will discuss this with her in detail at her next office visit. ______

## 2013-12-06 ENCOUNTER — Telehealth (INDEPENDENT_AMBULATORY_CARE_PROVIDER_SITE_OTHER): Payer: Self-pay

## 2013-12-06 NOTE — Telephone Encounter (Signed)
Pt given path result per Dr Darrel Hoover request. Pt states she is doing well and will keep po appt.

## 2013-12-19 ENCOUNTER — Encounter (INDEPENDENT_AMBULATORY_CARE_PROVIDER_SITE_OTHER): Payer: Self-pay | Admitting: General Surgery

## 2013-12-19 ENCOUNTER — Encounter: Payer: Self-pay | Admitting: Internal Medicine

## 2013-12-19 ENCOUNTER — Ambulatory Visit (INDEPENDENT_AMBULATORY_CARE_PROVIDER_SITE_OTHER): Payer: Medicare Other | Admitting: General Surgery

## 2013-12-19 VITALS — BP 128/80 | HR 77 | Temp 97.8°F | Resp 16 | Ht 63.0 in | Wt 210.8 lb

## 2013-12-19 DIAGNOSIS — D249 Benign neoplasm of unspecified breast: Secondary | ICD-10-CM

## 2013-12-19 DIAGNOSIS — D242 Benign neoplasm of left breast: Secondary | ICD-10-CM

## 2013-12-19 NOTE — Patient Instructions (Signed)
Your left breast is healing from the lumpectomy surgery without any complications.  The final pathology showed a benign intraductal papilloma. There was no evidence of cancer.  Be sure to continue to get mammograms yearly and to have your primary doctor examine your breasts yearly.  Return to see Dr. Dalbert Batman if needed.

## 2013-12-19 NOTE — Progress Notes (Signed)
Patient ID: JAVONDA SUH, female   DOB: 1940/09/22, 73 y.o.   MRN: [id number] History: This patient underwent a left lumpectomy with radioactive seed localization on 12/02/2013. She has had no healing problems. She is comfortable. The final pathology report shows a benign intraductal papilloma. I discussed this with her and gave her a copy of the pathology report.  Exam: Patient looks well. No distress Left breast is examined. Superiorly placed circumareolar incision healing normally. No fluid collection. No hematoma. No seroma.  Assessment: Intraductal papilloma left breast, recovering uneventfully following lumpectomy with RSL  Plan: Annual breast exam with her PCP. Annual mammography Return to see me if needed.   Edsel Petrin. Dalbert Batman, M.D., [facility] General and Minimally invasive Surgery Breast and Colorectal Surgery Office:   [phone] Pager:   [phone]

## 2014-01-08 ENCOUNTER — Encounter: Payer: Self-pay | Admitting: Internal Medicine

## 2014-02-20 ENCOUNTER — Ambulatory Visit (AMBULATORY_SURGERY_CENTER): Payer: Self-pay

## 2014-02-20 VITALS — Ht 63.0 in | Wt 215.6 lb

## 2014-02-20 DIAGNOSIS — Z860100 Personal history of colon polyps, unspecified: Secondary | ICD-10-CM

## 2014-02-20 MED ORDER — MOVIPREP 100 G PO SOLR: 1.0000 | Freq: Once | ORAL | Status: DC

## 2014-02-20 NOTE — Progress Notes (Signed)
No allergies to eggs or soy No home oxygen No past problems with anesthesia No diet/weight loss meds  Has email.  Emmi instructions given for colonoscopy. 

## 2014-03-05 ENCOUNTER — Encounter: Payer: Self-pay | Admitting: Internal Medicine

## 2014-03-05 ENCOUNTER — Ambulatory Visit (AMBULATORY_SURGERY_CENTER): Payer: Medicare Other | Admitting: Internal Medicine

## 2014-03-05 VITALS — BP 133/71 | HR 62 | Temp 97.0°F | Resp 22 | Ht 63.0 in | Wt 215.0 lb

## 2014-03-05 DIAGNOSIS — Z86010 Personal history of colonic polyps: Secondary | ICD-10-CM

## 2014-03-05 MED ORDER — SODIUM CHLORIDE 0.9 % IV SOLN: 500.0000 mL | INTRAVENOUS | Status: DC

## 2014-03-05 NOTE — Op Note (Signed)
[place]  [address] [place], [address]   COLONOSCOPY PROCEDURE REPORT  PATIENT: Neli, Fofana  MR#: [id number] BIRTHDATE: 27-Mar-1941 , 72  yrs. old GENDER: Female ENDOSCOPIST: Eustace Quail, MD REFERRED HQ:[id number] Program Recall PROCEDURE DATE:  03/05/2014 PROCEDURE:   Colonoscopy, surveillance First Screening Colonoscopy - Avg.  risk and is 50 yrs.  old or older - No.  Prior Negative Screening - Now for repeat screening. N/A  History of Adenoma - Now for follow-up colonoscopy & has been > or = to 3 yrs.  Yes hx of adenoma.  Has been 3 or more years since last colonoscopy.  Polyps Removed Today? No.  Recommend repeat exam, <10 yrs? No. ASA CLASS:   Class II INDICATIONS:Patient's personal history of adenomatous colon polyps. Index exam 2004 (large tubular adenoma); 2007 (small adenomas); 2010 (no polyps). MEDICATIONS: MAC sedation, administered by CRNA and propofol (Diprivan) 250mg  IV  DESCRIPTION OF PROCEDURE:   After the risks benefits and alternatives of the procedure were thoroughly explained, informed consent was obtained.  A digital rectal exam revealed no abnormalities of the rectum.   The LB [id number]  endoscope was introduced through the anus and advanced to the cecum, which was identified by both the appendix and ileocecal valve. No adverse events experienced.   The quality of the prep was excellent, using MoviPrep  The instrument was then slowly withdrawn as the colon was fully examined.      COLON FINDINGS: Severe diverticulosis was noted throughout the entire examined colon.   The colon was otherwise normal.  There was no diverticulosis, inflammation, polyps or cancers unless previously stated.  Retroflexed views revealed no abnormalities. The time to cecum=4 minutes 01 seconds.  Withdrawal time=8 minutes 21 seconds.  The scope was withdrawn and the procedure completed. COMPLICATIONS: There were no  complications.  ENDOSCOPIC IMPRESSION: 1.   Severe diverticulosis was noted throughout the entire examined colon 2.   The colon was otherwise normal  RECOMMENDATIONS: 1. Return to the care of your primary provider.  GI follow up as needed   eSigned:  Eustace Quail, MD 03/05/2014 12:46 PM   cc: Burnard Bunting, MD and The Patient

## 2014-03-05 NOTE — Patient Instructions (Signed)
YOU HAD AN ENDOSCOPIC PROCEDURE TODAY AT [facility]: Refer to the procedure report that was given to you for any specific questions about what was found during the examination.  If the procedure report does not answer your questions, please call your gastroenterologist to clarify.  If you requested that your care partner not be given the details of your procedure findings, then the procedure report has been included in a sealed envelope for you to review at your convenience later.  YOU SHOULD EXPECT: Some feelings of bloating in the abdomen. Passage of more gas than usual.  Walking can help get rid of the air that was put into your GI tract during the procedure and reduce the bloating. If you had a lower endoscopy (such as a colonoscopy or flexible sigmoidoscopy) you may notice spotting of blood in your stool or on the toilet paper. If you underwent a bowel prep for your procedure, then you may not have a normal bowel movement for a few days.  DIET: Your first meal following the procedure should be a light meal and then it is ok to progress to your normal diet.  A half-sandwich or bowl of soup is an example of a good first meal.  Heavy or fried foods are harder to digest and may make you feel nauseous or bloated.  Likewise meals heavy in dairy and vegetables can cause extra gas to form and this can also increase the bloating.  Drink plenty of fluids but you should avoid alcoholic beverages for 24 hours.  ACTIVITY: Your care partner should take you home directly after the procedure.  You should plan to take it easy, moving slowly for the rest of the day.  You can resume normal activity the day after the procedure however you should NOT DRIVE or use heavy machinery for 24 hours (because of the sedation medicines used during the test).    SYMPTOMS TO REPORT IMMEDIATELY: A gastroenterologist can be reached at any hour.  During normal business hours, 8:30 AM to 5:00 PM Monday through Friday,  call [phone].  After hours and on weekends, please call the GI answering service at [phone] who will take a message and have the physician on call contact you.   Following lower endoscopy (colonoscopy or flexible sigmoidoscopy):  Excessive amounts of blood in the stool  Significant tenderness or worsening of abdominal pains  Swelling of the abdomen that is new, acute  Fever of 100F or higher  Following upper endoscopy (EGD)  Vomiting of blood or coffee ground material  New chest pain or pain under the shoulder blades  Painful or persistently difficult swallowing  New shortness of breath  Fever of 100F or higher  Black, tarry-looking stools  FOLLOW UP: Our staff will call the home number listed on your records the next business day following your procedure to check on you and address any questions or concerns that you may have at that time regarding the information given to you following your procedure. This is a courtesy call and so if there is no answer at the home number and we have not heard from you through the emergency physician on call, we will assume that you have returned to your regular daily activities without incident.  SIGNATURES/CONFIDENTIALITY: You and/or your care partner have signed paperwork which will be entered into your electronic medical record.  These signatures attest to the fact that that the information above on your After Visit Summary has been reviewed and is  understood.  Full responsibility of the confidentiality of this discharge information lies with you and/or your care-partner.  Please continue your normal medications  Please read over handouts about diverticulosis and high fiber diets  Return to care of your primary physician- follow up with Dr. Henrene Pastor only as needed

## 2014-03-05 NOTE — Progress Notes (Signed)
A/ox3, pleased with MAC, report to RN 

## 2014-03-06 ENCOUNTER — Telehealth: Payer: Self-pay

## 2014-03-06 NOTE — Telephone Encounter (Signed)
Left a message at [phone] for the pt to call us back if any questions or concerns. Maw

## 2014-10-29 ENCOUNTER — Ambulatory Visit (INDEPENDENT_AMBULATORY_CARE_PROVIDER_SITE_OTHER): Payer: Medicare Other | Admitting: Podiatry

## 2014-10-29 ENCOUNTER — Encounter: Payer: Self-pay | Admitting: Podiatry

## 2014-10-29 VITALS — Ht 63.5 in | Wt 210.0 lb

## 2014-10-29 DIAGNOSIS — Q828 Other specified congenital malformations of skin: Secondary | ICD-10-CM

## 2014-10-29 NOTE — Progress Notes (Signed)
   Subjective:    Patient ID: Tara Patrick, female    DOB: [birth year], 74 y.o.   MRN: [id number]  HPI Comments: N callouses L left plantar 3rd MPJ sub D and O 3 months C hard painful skin A walking T medicated corn pads  Pt states she has a soft corn between right 4, 5 th toes.     Review of Systems  All other systems reviewed and are negative.      Objective:   Physical Exam  Orientated 3  Vascular: DP pulses 2/4 bilaterally PT pulses 2/4 bilaterally Capillary reflex immediate bilaterally  Neurological: Sensation to 10 g monofilament wire intact 5/5 bilaterally Vibratory sensation intact bilaterally  Dermatological: Corn fourth right webspace Nucleated plantar keratoses 2, sub-second MPJ left Well-healed surgical scars first MPJ bilaterally fifth right toe  Musculoskeletal: Underlapped been third toes bilaterally No restriction ankle, subtalar, midtarsal joints bilaterally    Assessment & Plan:   Assessment: Satisfactory neurovascular status Soft corn fourth right webspace Porokeratosis 2 plantar left  Plan: Debridement of soft corn and porokeratosis 2 Apply salinocaine to the plantar keratoses left  Reappoint at patient's request

## 2015-03-03 ENCOUNTER — Encounter: Payer: Self-pay | Admitting: Podiatry

## 2015-03-03 ENCOUNTER — Ambulatory Visit (INDEPENDENT_AMBULATORY_CARE_PROVIDER_SITE_OTHER): Payer: Medicare Other | Admitting: Podiatry

## 2015-03-03 VITALS — BP 136/76 | HR 76 | Resp 12

## 2015-03-03 DIAGNOSIS — Q828 Other specified congenital malformations of skin: Secondary | ICD-10-CM | POA: Diagnosis not present

## 2015-03-03 NOTE — Patient Instructions (Signed)
Return as needed for debridement of painful callouses on the left foot

## 2015-03-03 NOTE — Progress Notes (Signed)
Patient ID: Tara Patrick, female   DOB: 1941/07/27, 74 y.o.   MRN: [id number]  Subjective: This patient presents complaining of painful keratoses on the plantar aspect left foot  Objective: 2 punctate keratoses sharply nucleated plantar second left MPJ  Assessment: Porokeratosis 2  Plan: Debridement of keratoses 2 and packed with salinocaine  Reappoint at patient's request

## 2015-06-23 ENCOUNTER — Ambulatory Visit (INDEPENDENT_AMBULATORY_CARE_PROVIDER_SITE_OTHER): Payer: Medicare Other | Admitting: Podiatry

## 2015-06-23 ENCOUNTER — Encounter: Payer: Self-pay | Admitting: Podiatry

## 2015-06-23 DIAGNOSIS — Q828 Other specified congenital malformations of skin: Secondary | ICD-10-CM | POA: Diagnosis not present

## 2015-06-23 IMAGING — MG MM DIGITAL DIAGNOSTIC UNILAT*L*
2 series · 2 of 2 positions shown · non-contrast
Comparison: Prior examinations dating back to 08/21/2007.

CLINICAL DATA: Patient recalled from screening for left breast
calcifications.

EXAM:
DIGITAL DIAGNOSTIC  LEFT MAMMOGRAM

[L CC]
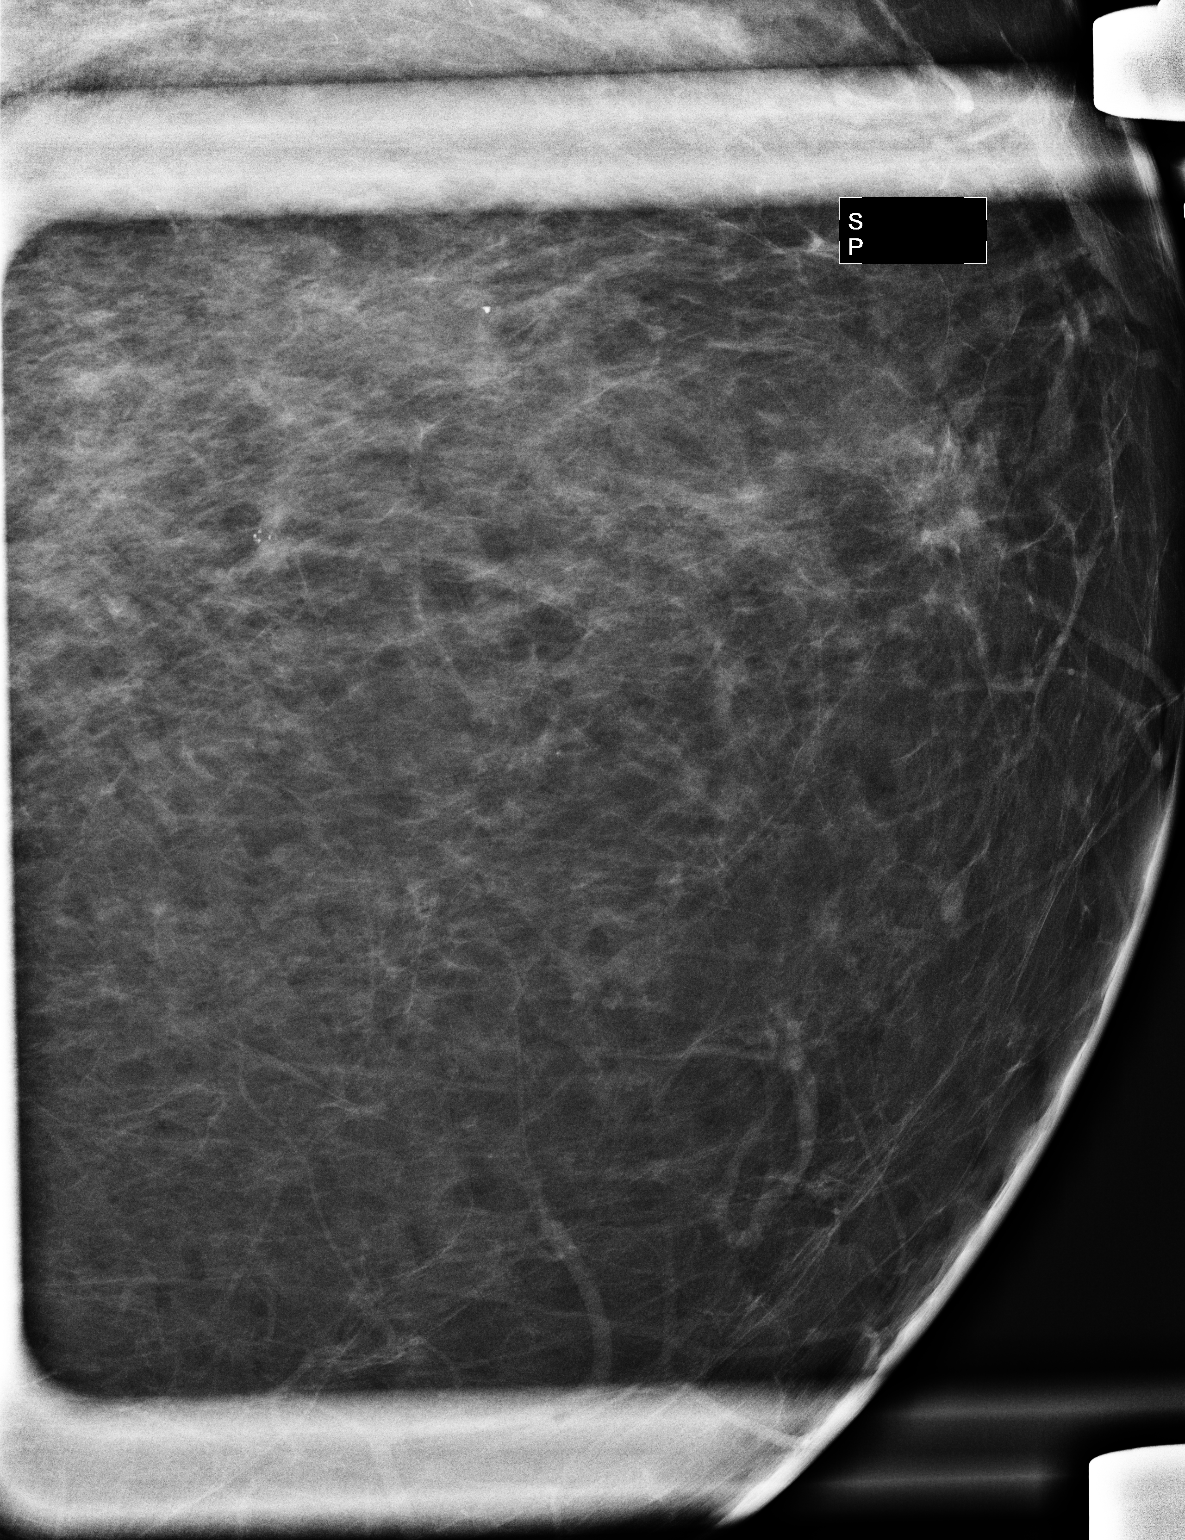

[L ML]
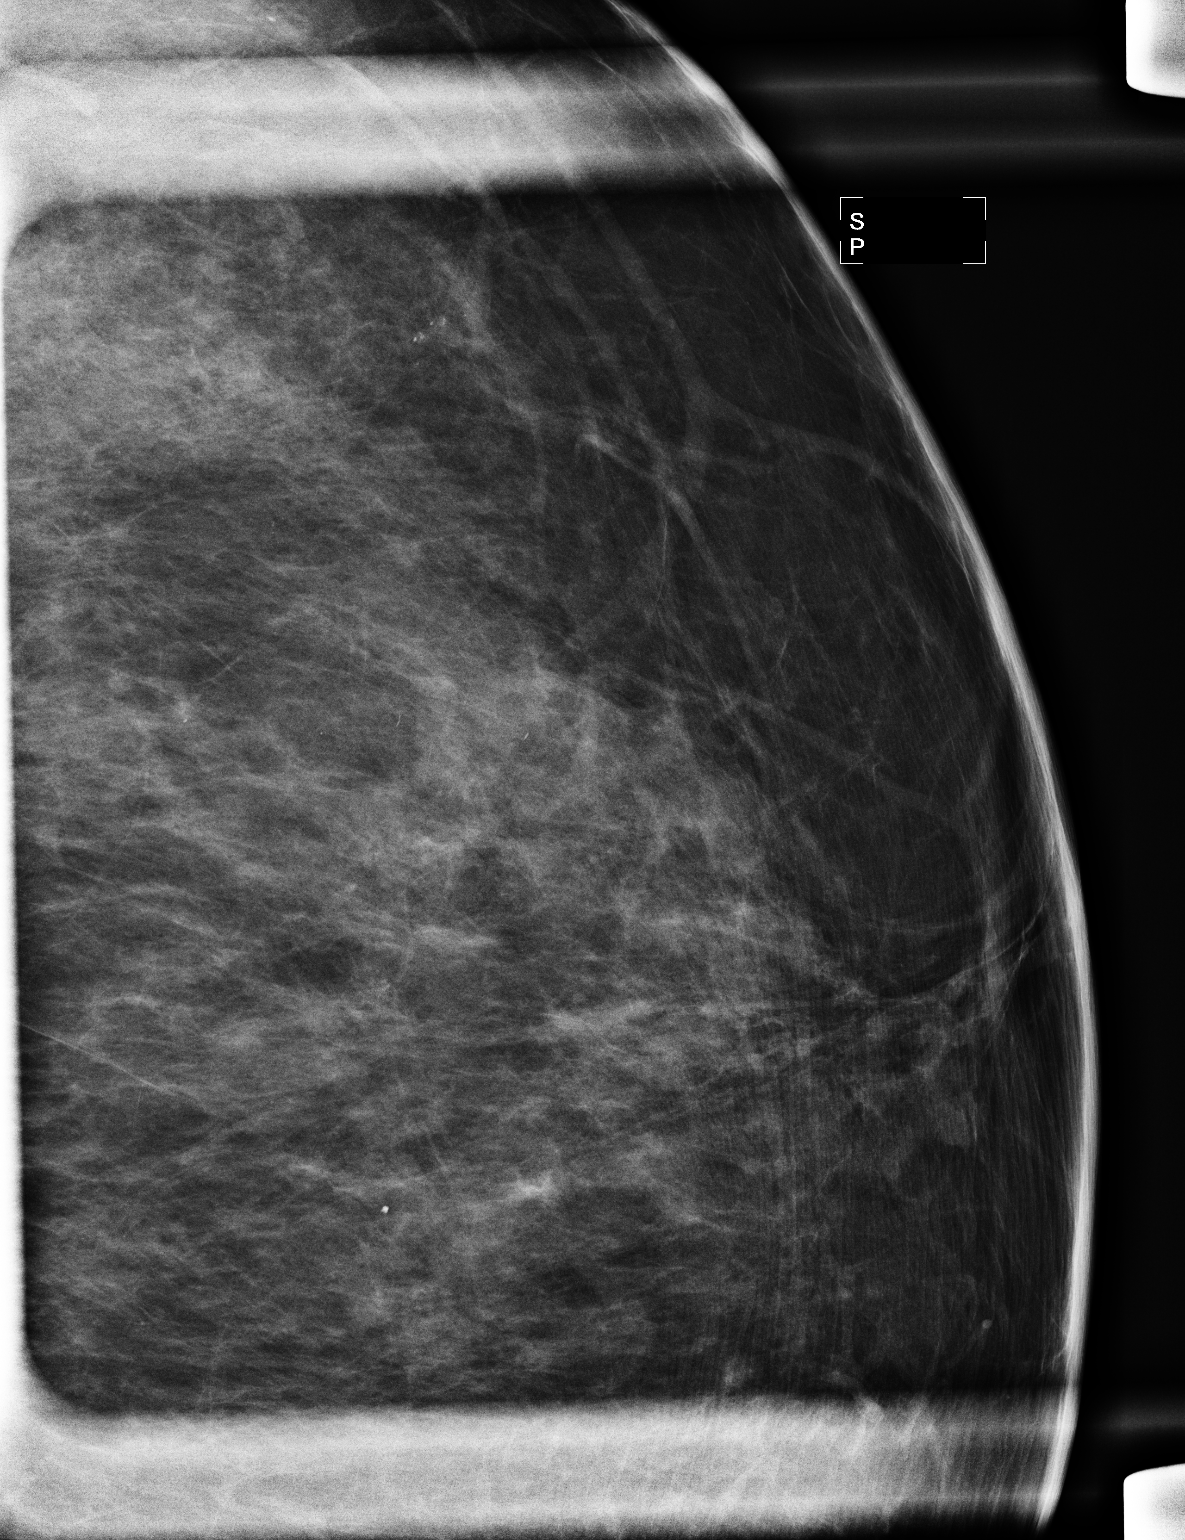

[2 of 2 positions shown; findings below may reference images not displayed]

ACR Breast Density Category c: The breast tissue is heterogeneously
dense, which may obscure small masses.
FINDINGS: Spot compression magnification views demonstrate a 4 x 6 x 3 mm
group of coarse heterogeneous calcifications within the upper inner
quadrant left breast anterior depth.
IMPRESSION: Suspicious left breast calcifications.

RECOMMENDATION:
Left breast stereotactic core needle biopsy. This is scheduled for
[REDACTED] 10/21/2013 at 2 p.m.

I have discussed the findings and recommendations with the patient.
Results were also provided in writing at the conclusion of the
visit. If applicable, a reminder letter will be sent to the patient
regarding the next appointment.

BI-RADS CATEGORY  4: Suspicious abnormality - biopsy should be
considered.

## 2015-06-23 NOTE — Progress Notes (Signed)
Patient ID: Tara Patrick, female   DOB: 09-12-1941, 74 y.o.   MRN: [id number]  Subjective: This patient presents today complaining of painful nucleated keratoses plantar aspect left foot  Objective: Nuclear keratoses plantar subsecond MPJ and first MPJ left  Assessment: Porokeratosis 2  Plan: Debride porokeratosis 2 and packed with salinocaine  Reappoint at patient's request

## 2015-07-25 ENCOUNTER — Emergency Department (HOSPITAL_COMMUNITY)
Admission: EM | Admit: 2015-07-25 | Discharge: 2015-07-25 | Disposition: A | Discharge status: Home / Self Care | Payer: Medicare Other | Attending: Emergency Medicine | Admitting: Emergency Medicine

## 2015-07-25 ENCOUNTER — Encounter (HOSPITAL_COMMUNITY): Payer: Self-pay

## 2015-07-25 ENCOUNTER — Emergency Department (HOSPITAL_COMMUNITY): Payer: Medicare Other

## 2015-07-25 DIAGNOSIS — M25562 Pain in left knee: Secondary | ICD-10-CM

## 2015-07-25 DIAGNOSIS — Y9289 Other specified places as the place of occurrence of the external cause: Secondary | ICD-10-CM | POA: Diagnosis not present

## 2015-07-25 DIAGNOSIS — Z86711 Personal history of pulmonary embolism: Secondary | ICD-10-CM | POA: Diagnosis not present

## 2015-07-25 DIAGNOSIS — Y9389 Activity, other specified: Secondary | ICD-10-CM | POA: Insufficient documentation

## 2015-07-25 DIAGNOSIS — E785 Hyperlipidemia, unspecified: Secondary | ICD-10-CM | POA: Diagnosis not present

## 2015-07-25 DIAGNOSIS — S8992XA Unspecified injury of left lower leg, initial encounter: Secondary | ICD-10-CM | POA: Insufficient documentation

## 2015-07-25 DIAGNOSIS — Y998 Other external cause status: Secondary | ICD-10-CM | POA: Diagnosis not present

## 2015-07-25 DIAGNOSIS — Z79899 Other long term (current) drug therapy: Secondary | ICD-10-CM | POA: Diagnosis not present

## 2015-07-25 DIAGNOSIS — I10 Essential (primary) hypertension: Secondary | ICD-10-CM | POA: Insufficient documentation

## 2015-07-25 DIAGNOSIS — X58XXXA Exposure to other specified factors, initial encounter: Secondary | ICD-10-CM | POA: Diagnosis not present

## 2015-07-25 MED ORDER — HYDROCODONE-ACETAMINOPHEN 5-325 MG PO TABS: 1.0000 | ORAL_TABLET | Freq: Four times a day (QID) | ORAL | Status: AC | PRN

## 2015-07-25 MED ORDER — HYDROCODONE-ACETAMINOPHEN 5-325 MG PO TABS
2.0000 | ORAL_TABLET | Freq: Once | ORAL | Status: AC
  Administered 2015-07-25: 2 via ORAL
  Filled 2015-07-25: qty 2

## 2015-07-25 NOTE — ED Notes (Signed)
She c/o left knee pain since stepping off a curb today.  She is in no distress.

## 2015-07-25 NOTE — ED Notes (Signed)
No swelling noted. Patient able to fully extend the left leg but it is painful to do so and painful to bear weight. Patient took extra strength tylenol this evening with no relief.

## 2015-07-25 NOTE — ED Provider Notes (Signed)
CSN: [id number]     Arrival date & time 07/25/15  1651 History  By signing my name below, I, Tara Patrick, attest that this documentation has been prepared under the direction and in the presence of Tara Grana, PA-C. Electronically Signed: Irene Patrick, ED Scribe. 07/25/2015. 7:52 PM.  Chief Complaint  Patient presents with  . Knee Pain   The history is provided by the patient. No language interpreter was used.  HPI Comments: Tara Patrick is a 74 y.o. Female with a hx of HTN and PE who presents to the Emergency Department complaining of left knee pain onset earlier today. She states that she was stepping off of a curb when her knee "gave out" under her, but she denies falling. Pt reports that she has not been able to bear weight on the leg and pain worsens with flexion and extension of the knee. She reports taking extra strength tylenol PTA to no relief. She denies hx of similar symptoms. She denies joint swelling, weakness, or numbness.   Past Medical History  Diagnosis Date  . Hypertension   . Hyperlipidemia   . History of pulmonary embolism     2001-not sure why  . Wears glasses   . Hypothyroidism    Past Surgical History  Procedure Laterality Date  . Abdominal hysterectomy    . Bunionectomy      both feet  . Colonoscopy    . Breast lumpectomy with radioactive seed localization Left 12/02/2013    Procedure: LEFT BREAST LUMPECTOMY WITH RADIOACTIVE SEED LOCALIZATION;  Surgeon: Adin Hector, MD;  Location: [facility];  Service: General;  Laterality: Left;   Family History  Problem Relation Age of Onset  . Colon cancer Neg Hx   . Stomach cancer Neg Hx   . Rectal cancer Neg Hx   . Pancreatic cancer Neg Hx    Social History  Substance Use Topics  . Smoking status: Never Smoker   . Smokeless tobacco: Never Used  . Alcohol Use: No   OB History    No data available     Review of Systems  Musculoskeletal: Positive for arthralgias. Negative for  joint swelling.  Neurological: Negative for weakness and numbness.  All other systems reviewed and are negative.  Allergies  Review of patient's allergies indicates no known allergies.  Home Medications   Filed Vitals:   07/25/15 1935  BP: 133/70  Pulse: 64  Temp: 98.5 F (36.9 C)  Resp: 16    Prior to Admission medications   Medication Sig Start Date End Date Taking? Authorizing Provider  acetaminophen (TYLENOL) 500 MG tablet Take 1,000 mg by mouth every 6 (six) hours as needed for mild pain, moderate pain or headache.   Yes Historical Provider, MD  Black Cohosh 175 MG CAPS Take 175 mg by mouth daily.    Yes Historical Provider, MD  calcium carbonate (OS-CAL - DOSED IN MG OF ELEMENTAL CALCIUM) 1250 MG tablet Take 1 tablet by mouth daily with breakfast.    Yes Historical Provider, MD  cholecalciferol (VITAMIN D) 1000 UNITS tablet Take 1,000 Units by mouth daily.   Yes Historical Provider, MD  lisinopril-hydrochlorothiazide (PRINZIDE,ZESTORETIC) 10-12.5 MG per tablet Take 1 tablet by mouth daily.  09/14/13  Yes Historical Provider, MD  pravastatin (PRAVACHOL) 40 MG tablet Take 40 mg by mouth daily.  09/21/13  Yes Historical Provider, MD  vitamin B-12 (CYANOCOBALAMIN) 1000 MCG tablet Take 1,000 mcg by mouth daily.   Yes Historical Provider, MD  BP 133/70 mmHg  Pulse 64  Temp(Src) 98.5 F (36.9 C) (Oral)  Resp 16  SpO2 99%  Physical Exam  Constitutional: She is oriented to person, place, and time. She appears well-developed and well-nourished. No distress.  HENT:  Head: Normocephalic and atraumatic.  Right Ear: External ear normal.  Left Ear: External ear normal.  Nose: Nose normal.  Mouth/Throat: Oropharynx is clear and moist. No oropharyngeal exudate.  Eyes: Conjunctivae and EOM are normal. Pupils are equal, round, and reactive to light. Right eye exhibits no discharge. Left eye exhibits no discharge. No scleral icterus.  Neck: Normal range of motion. Neck supple. No JVD  present. No tracheal deviation present.  Cardiovascular: Normal rate and regular rhythm.   Pulmonary/Chest: Effort normal and breath sounds normal. No stridor. No respiratory distress.  Musculoskeletal: Normal range of motion. She exhibits tenderness. She exhibits no edema.       Left knee: She exhibits normal range of motion, no swelling, no effusion, no ecchymosis, no laceration, no erythema, normal alignment, no LCL laxity, normal patellar mobility, no bony tenderness and no MCL laxity. Tenderness found. MCL tenderness noted.  Negative anterior drawer test, normal ROM  Lymphadenopathy:    She has no cervical adenopathy.  Neurological: She is alert and oriented to person, place, and time. She exhibits normal muscle tone. Coordination normal.  Skin: Skin is warm and dry. No rash noted. She is not diaphoretic. No erythema. No pallor.  Psychiatric: She has a normal mood and affect. Her behavior is normal. Judgment and thought content normal.    ED Course  Procedures (including critical care time) DIAGNOSTIC STUDIES: Oxygen Saturation is 99% on RA, normal by my interpretation.    COORDINATION OF CARE: 7:51 PM-Discussed treatment plan which includes RICE techniques and ibuprofenwith pt at bedside and pt agreed to plan.   Labs Review Labs Reviewed - No data to display  Imaging Review Dg Knee Complete 4 Views Left  07/25/2015  CLINICAL DATA:  Twisting injury left knee today while walking with onset of pain and swelling. Initial encounter. EXAM: LEFT KNEE - COMPLETE 4+ VIEW COMPARISON:  None. FINDINGS: There is no evidence of fracture, dislocation, or joint effusion. There is no evidence of arthropathy or other focal bone abnormality. Soft tissues are unremarkable. IMPRESSION: Negative exam. Electronically Signed   By: Inge Rise M.D.   On: 07/25/2015 18:29   I have personally reviewed and evaluated these images and lab results as part of my medical decision-making.   EKG  Interpretation None      MDM   Final diagnoses:  None    Pt with sudden onset knee pain after stepping down off the bus, has difficulty standing and bearing weight, no difficulty with full extension, painful flexion and painful anterior drawer, but not observed laxity.  Will immobilize, give crutches, pain meds, ortho follow up.    Dr. Tyrone Nine has also seen and evaluated the pt and is in agreement with assessment and treatment.  I personally performed the services described in this documentation, which was scribed in my presence. The recorded information has been reviewed and is accurate.      Tara Grana, PA-C 07/28/15 [place], DO 07/28/15 [phone]

## 2015-07-25 NOTE — Discharge Instructions (Signed)
Cryotherapy °Cryotherapy means treatment with cold. Ice or gel packs can be used to reduce both pain and swelling. Ice is the most helpful within the first 24 to 48 hours after an injury or flare-up from overusing a muscle or joint. Sprains, strains, spasms, burning pain, shooting pain, and aches can all be eased with ice. Ice can also be used when recovering from surgery. Ice is effective, has very few side effects, and is safe for most people to use. °PRECAUTIONS  °Ice is not a safe treatment option for people with: °· Raynaud phenomenon. This is a condition affecting small blood vessels in the extremities. Exposure to cold may cause your problems to return. °· Cold hypersensitivity. There are many forms of cold hypersensitivity, including: °· Cold urticaria. Red, itchy hives appear on the skin when the tissues begin to warm after being iced. °· Cold erythema. This is a red, itchy rash caused by exposure to cold. °· Cold hemoglobinuria. Red blood cells break down when the tissues begin to warm after being iced. The hemoglobin that carry oxygen are passed into the urine because they cannot combine with blood proteins fast enough. °· Numbness or altered sensitivity in the area being iced. °If you have any of the following conditions, do not use ice until you have discussed cryotherapy with your caregiver: °· Heart conditions, such as arrhythmia, angina, or chronic heart disease. °· High blood pressure. °· Healing wounds or open skin in the area being iced. °· Current infections. °· Rheumatoid arthritis. °· Poor circulation. °· Diabetes. °Ice slows the blood flow in the region it is applied. This is beneficial when trying to stop inflamed tissues from spreading irritating chemicals to surrounding tissues. However, if you expose your skin to cold temperatures for too long or without the proper protection, you can damage your skin or nerves. Watch for signs of skin damage due to cold. °HOME CARE INSTRUCTIONS °Follow  these tips to use ice and cold packs safely. °· Place a dry or damp towel between the ice and skin. A damp towel will cool the skin more quickly, so you may need to shorten the time that the ice is used. °· For a more rapid response, add gentle compression to the ice. °· Ice for no more than 10 to 20 minutes at a time. The bonier the area you are icing, the less time it will take to get the benefits of ice. °· Check your skin after 5 minutes to make sure there are no signs of a poor response to cold or skin damage. °· Rest 20 minutes or more between uses. °· Once your skin is numb, you can end your treatment. You can test numbness by very lightly touching your skin. The touch should be so light that you do not see the skin dimple from the pressure of your fingertip. When using ice, most people will feel these normal sensations in this order: cold, burning, aching, and numbness. °· Do not use ice on someone who cannot communicate their responses to pain, such as small children or people with dementia. °HOW TO MAKE AN ICE PACK °Ice packs are the most common way to use ice therapy. Other methods include ice massage, ice baths, and cryosprays. Muscle creams that cause a cold, tingly feeling do not offer the same benefits that ice offers and should not be used as a substitute unless recommended by your caregiver. °To make an ice pack, do one of the following: °· Place crushed ice or a   bag of frozen vegetables in a sealable plastic bag. Squeeze out the excess air. Place this bag inside another plastic bag. Slide the bag into a pillowcase or place a damp towel between your skin and the bag.  Mix 3 parts water with 1 part rubbing alcohol. Freeze the mixture in a sealable plastic bag. When you remove the mixture from the freezer, it will be slushy. Squeeze out the excess air. Place this bag inside another plastic bag. Slide the bag into a pillowcase or place a damp towel between your skin and the bag. SEEK MEDICAL CARE  IF:  You develop white spots on your skin. This may give the skin a blotchy (mottled) appearance.  Your skin turns blue or pale.  Your skin becomes waxy or hard.  Your swelling gets worse. MAKE SURE YOU:   Understand these instructions.  Will watch your condition.  Will get help right away if you are not doing well or get worse.   This information is not intended to replace advice given to you by your health care provider. Make sure you discuss any questions you have with your health care provider.   Document Released: 04/25/2011 Document Revised: 09/19/2014 Document Reviewed: 04/25/2011 Elsevier Interactive Patient Education 2016 [place] therapy can help ease sore, stiff, injured, and tight muscles and joints. Heat relaxes your muscles, which may help ease your pain.  RISKS AND COMPLICATIONS If you have any of the following conditions, do not use heat therapy unless your health care provider has approved:  Poor circulation.  Healing wounds or scarred skin in the area being treated.  Diabetes, heart disease, or high blood pressure.  Not being able to feel (numbness) the area being treated.  Unusual swelling of the area being treated.  Active infections.  Blood clots.  Cancer.  Inability to communicate pain. This may include young children and people who have problems with their brain function (dementia).  Pregnancy. Heat therapy should only be used on old, pre-existing, or long-lasting (chronic) injuries. Do not use heat therapy on new injuries unless directed by your health care provider. HOW TO USE HEAT THERAPY There are several different kinds of heat therapy, including:  Moist heat pack.  Warm water bath.  Hot water bottle.  Electric heating pad.  Heated gel pack.  Heated wrap.  Electric heating pad. Use the heat therapy method suggested by your health care provider. Follow your health care provider's instructions on when  and how to use heat therapy. GENERAL HEAT THERAPY RECOMMENDATIONS  Do not sleep while using heat therapy. Only use heat therapy while you are awake.  Your skin may turn pink while using heat therapy. Do not use heat therapy if your skin turns red.  Do not use heat therapy if you have new pain.  High heat or long exposure to heat can cause burns. Be careful when using heat therapy to avoid burning your skin.  Do not use heat therapy on areas of your skin that are already irritated, such as with a rash or sunburn. SEEK MEDICAL CARE IF:  You have blisters, redness, swelling, or numbness.  You have new pain.  Your pain is worse. MAKE SURE YOU:  Understand these instructions.  Will watch your condition.  Will get help right away if you are not doing well or get worse.   This information is not intended to replace advice given to you by your health care provider. Make sure you discuss any questions you  have with your health care provider.   Document Released: 11/21/2011 Document Revised: 09/19/2014 Document Reviewed: 10/22/2013 Elsevier Interactive Patient Education 2016 [employer].  How to Use a Knee Brace A knee brace is a device that you wear to support your knee, especially if the knee is healing after an injury or surgery. There are several types of knee braces. Some are designed to prevent an injury (prophylactic brace). These are often worn during sports. Others support an injured knee (functional brace) or keep it still while it heals (rehabilitative brace). People with severe arthritis of the knee may benefit from a brace that takes some pressure off the knee (unloader brace). Most knee braces are made from a combination of cloth and metal or plastic.  You may need to wear a knee brace to:  Relieve knee pain.  Help your knee support your weight (improve stability).  Help you walk farther (improve mobility).  Prevent injury.  Support your knee while it heals from  surgery or from an injury. RISKS AND COMPLICATIONS Generally, knee braces are very safe to wear. However, problems may occur, including:  Skin irritation that may lead to infection.  Making your condition worse if you wear the brace in the wrong way. HOW TO USE A KNEE BRACE Different braces will have different instructions for use. Your health care provider will tell you or show you:  How to put on your brace.  How to adjust the brace.  When and how often to wear the brace.  How to remove the brace.  If you will need any assistive devices in addition to the brace, such as crutches or a cane. In general, your brace should:  Have the hinge of the brace line up with the bend of your knee.  Have straps, hooks, or tapes that fasten snugly around your leg.  Not feel too tight or too loose. HOW TO CARE FOR A KNEE BRACE  Check your brace often for signs of damage, such as loose connections or attachments. Your knee brace may get damaged or wear out during normal use.  Wash the fabric parts of your brace with soap and water.  Read the insert that comes with your brace for other specific care instructions. SEEK MEDICAL CARE IF:  Your knee brace is too loose or too tight and you cannot adjust it.  Your knee brace causes skin redness, swelling, bruising, or irritation.  Your knee brace is not helping.  Your knee brace is making your knee pain worse.   This information is not intended to replace advice given to you by your health care provider. Make sure you discuss any questions you have with your health care provider.   Document Released: 11/19/2003 Document Revised: 05/20/2015 Document Reviewed: 12/22/2014 Elsevier Interactive Patient Education 2016 [employer]  Knee Pain Knee pain is a very common symptom and can have many causes. Knee pain often goes away when you follow your health care provider's instructions for relieving pain and discomfort at home. However, knee  pain can develop into a condition that needs treatment. Some conditions may include:  Arthritis caused by wear and tear (osteoarthritis).  Arthritis caused by swelling and irritation (rheumatoid arthritis or gout).  A cyst or growth in your knee.  An infection in your knee joint.  An injury that will not heal.  Damage, swelling, or irritation of the tissues that support your knee (torn ligaments or tendinitis). If your knee pain continues, additional tests may be ordered to diagnose  your condition. Tests may include X-rays or other imaging studies of your knee. You may also need to have fluid removed from your knee. Treatment for ongoing knee pain depends on the cause, but treatment may include:  Medicines to relieve pain or swelling.  Steroid injections in your knee.  Physical therapy.  Surgery. HOME CARE INSTRUCTIONS  Take medicines only as directed by your health care provider.  Rest your knee and keep it raised (elevated) while you are resting.  Do not do things that cause or worsen pain.  Avoid high-impact activities or exercises, such as running, jumping rope, or doing jumping jacks.  Apply ice to the knee area:  Put ice in a plastic bag.  Place a towel between your skin and the bag.  Leave the ice on for 20 minutes, 2-3 times a day.  Ask your health care provider if you should wear an elastic knee support.  Keep a pillow under your knee when you sleep.  Lose weight if you are overweight. Extra weight can put pressure on your knee.  Do not use any tobacco products, including cigarettes, chewing tobacco, or electronic cigarettes. If you need help quitting, ask your health care provider. Smoking may slow the healing of any bone and joint problems that you may have. SEEK MEDICAL CARE IF:  Your knee pain continues, changes, or gets worse.  You have a fever along with knee pain.  Your knee buckles or locks up.  Your knee becomes more swollen. SEEK IMMEDIATE  MEDICAL CARE IF:   Your knee joint feels hot to the touch.  You have chest pain or trouble breathing.   This information is not intended to replace advice given to you by your health care provider. Make sure you discuss any questions you have with your health care provider.   Document Released: 06/26/2007 Document Revised: 09/19/2014 Document Reviewed: 04/14/2014 Elsevier Interactive Patient Education [employer].

## 2015-12-14 ENCOUNTER — Encounter: Payer: Self-pay | Admitting: Internal Medicine

## 2015-12-24 ENCOUNTER — Other Ambulatory Visit (HOSPITAL_COMMUNITY): Payer: Self-pay | Admitting: Diagnostic Radiology

## 2015-12-24 DIAGNOSIS — Z1231 Encounter for screening mammogram for malignant neoplasm of breast: Secondary | ICD-10-CM

## 2016-01-15 ENCOUNTER — Ambulatory Visit
Admission: RE | Admit: 2016-01-15 | Discharge: 2016-01-15 | Disposition: A | Discharge status: Home / Self Care | Payer: Medicare Other | Source: Ambulatory Visit | Attending: Diagnostic Radiology | Admitting: Diagnostic Radiology

## 2016-01-15 DIAGNOSIS — Z1231 Encounter for screening mammogram for malignant neoplasm of breast: Secondary | ICD-10-CM

## 2017-01-06 ENCOUNTER — Other Ambulatory Visit: Payer: Self-pay | Admitting: Internal Medicine

## 2017-01-06 DIAGNOSIS — Z1231 Encounter for screening mammogram for malignant neoplasm of breast: Secondary | ICD-10-CM

## 2017-01-27 ENCOUNTER — Ambulatory Visit
Admission: RE | Admit: 2017-01-27 | Discharge: 2017-01-27 | Disposition: A | Discharge status: Home / Self Care | Payer: Medicare Other | Source: Ambulatory Visit | Attending: Internal Medicine | Admitting: Internal Medicine

## 2017-01-27 DIAGNOSIS — Z1231 Encounter for screening mammogram for malignant neoplasm of breast: Secondary | ICD-10-CM

## 2017-12-19 ENCOUNTER — Other Ambulatory Visit: Payer: Self-pay | Admitting: Internal Medicine

## 2017-12-19 DIAGNOSIS — Z1231 Encounter for screening mammogram for malignant neoplasm of breast: Secondary | ICD-10-CM

## 2018-01-29 ENCOUNTER — Ambulatory Visit
Admission: RE | Admit: 2018-01-29 | Discharge: 2018-01-29 | Disposition: A | Discharge status: Home / Self Care | Payer: Medicare Other | Source: Ambulatory Visit | Attending: Internal Medicine | Admitting: Internal Medicine

## 2018-01-29 DIAGNOSIS — Z1231 Encounter for screening mammogram for malignant neoplasm of breast: Secondary | ICD-10-CM

## 2018-12-27 ENCOUNTER — Other Ambulatory Visit: Payer: Self-pay | Admitting: Internal Medicine

## 2018-12-27 DIAGNOSIS — Z1231 Encounter for screening mammogram for malignant neoplasm of breast: Secondary | ICD-10-CM

## 2019-03-01 ENCOUNTER — Ambulatory Visit
Admission: RE | Admit: 2019-03-01 | Discharge: 2019-03-01 | Disposition: A | Discharge status: Home / Self Care | Payer: Medicare Other | Source: Ambulatory Visit | Attending: Internal Medicine | Admitting: Internal Medicine

## 2019-03-01 ENCOUNTER — Other Ambulatory Visit: Payer: Self-pay

## 2019-03-01 DIAGNOSIS — Z1231 Encounter for screening mammogram for malignant neoplasm of breast: Secondary | ICD-10-CM

## 2019-03-05 ENCOUNTER — Other Ambulatory Visit: Payer: Self-pay | Admitting: Internal Medicine

## 2019-03-05 DIAGNOSIS — R928 Other abnormal and inconclusive findings on diagnostic imaging of breast: Secondary | ICD-10-CM

## 2019-03-06 ENCOUNTER — Other Ambulatory Visit: Payer: Self-pay | Admitting: Internal Medicine

## 2019-03-07 ENCOUNTER — Ambulatory Visit
Admission: RE | Admit: 2019-03-07 | Discharge: 2019-03-07 | Disposition: A | Discharge status: Home / Self Care | Payer: Medicare Other | Source: Ambulatory Visit | Attending: Internal Medicine | Admitting: Internal Medicine

## 2019-03-07 ENCOUNTER — Other Ambulatory Visit: Payer: Self-pay

## 2019-03-07 DIAGNOSIS — R928 Other abnormal and inconclusive findings on diagnostic imaging of breast: Secondary | ICD-10-CM

## 2019-08-26 ENCOUNTER — Other Ambulatory Visit: Payer: Self-pay

## 2019-08-26 DIAGNOSIS — Z20822 Contact with and (suspected) exposure to covid-19: Secondary | ICD-10-CM

## 2019-08-28 LAB — NOVEL CORONAVIRUS, NAA: SARS-CoV-2, NAA: DETECTED — AB

## 2019-08-29 ENCOUNTER — Telehealth: Payer: Self-pay | Admitting: Nurse Practitioner

## 2019-08-29 NOTE — Telephone Encounter (Signed)
Called to Discuss with patient about Covid symptoms and the use of bamlanivimab, a monoclonal antibody infusion for those with mild to moderate Covid symptoms and at a high risk of hospitalization.     Pt is qualified for this infusion at the [facility] infusion center due to co-morbid conditions and/or a member of an at-risk group.    Patient states that symptoms are more than 10 days out and therefore does not meet criteria.

## 2020-01-27 ENCOUNTER — Other Ambulatory Visit: Payer: Self-pay | Admitting: Internal Medicine

## 2020-01-27 DIAGNOSIS — Z1231 Encounter for screening mammogram for malignant neoplasm of breast: Secondary | ICD-10-CM

## 2020-03-02 ENCOUNTER — Ambulatory Visit
Admission: RE | Admit: 2020-03-02 | Discharge: 2020-03-02 | Disposition: A | Discharge status: Home / Self Care | Payer: Medicare Other | Source: Ambulatory Visit | Attending: Internal Medicine | Admitting: Internal Medicine

## 2020-03-02 ENCOUNTER — Other Ambulatory Visit: Payer: Self-pay

## 2020-03-02 DIAGNOSIS — Z1231 Encounter for screening mammogram for malignant neoplasm of breast: Secondary | ICD-10-CM

## 2021-02-22 ENCOUNTER — Other Ambulatory Visit: Payer: Self-pay | Admitting: Internal Medicine

## 2021-02-22 DIAGNOSIS — Z1231 Encounter for screening mammogram for malignant neoplasm of breast: Secondary | ICD-10-CM

## 2021-03-16 ENCOUNTER — Ambulatory Visit
Admission: RE | Admit: 2021-03-16 | Discharge: 2021-03-16 | Disposition: A | Discharge status: Home / Self Care | Payer: Medicare Other | Source: Ambulatory Visit | Attending: Internal Medicine | Admitting: Internal Medicine

## 2021-03-16 ENCOUNTER — Other Ambulatory Visit: Payer: Self-pay

## 2021-03-16 DIAGNOSIS — Z1231 Encounter for screening mammogram for malignant neoplasm of breast: Secondary | ICD-10-CM

## 2022-02-11 ENCOUNTER — Other Ambulatory Visit: Payer: Self-pay | Admitting: Internal Medicine

## 2022-02-11 DIAGNOSIS — Z1231 Encounter for screening mammogram for malignant neoplasm of breast: Secondary | ICD-10-CM

## 2022-03-17 ENCOUNTER — Ambulatory Visit: Payer: Medicare Other

## 2022-04-06 ENCOUNTER — Ambulatory Visit: Payer: Medicare Other

## 2022-05-24 ENCOUNTER — Ambulatory Visit: Payer: Medicare Other

## 2022-09-30 ENCOUNTER — Other Ambulatory Visit: Payer: Self-pay

## 2022-09-30 ENCOUNTER — Encounter (HOSPITAL_BASED_OUTPATIENT_CLINIC_OR_DEPARTMENT_OTHER): Payer: Self-pay

## 2022-09-30 ENCOUNTER — Emergency Department (HOSPITAL_BASED_OUTPATIENT_CLINIC_OR_DEPARTMENT_OTHER): Payer: Medicare Other

## 2022-09-30 ENCOUNTER — Emergency Department (HOSPITAL_BASED_OUTPATIENT_CLINIC_OR_DEPARTMENT_OTHER)
Admission: EM | Admit: 2022-09-30 | Discharge: 2022-10-01 | Disposition: A | Discharge status: Home / Self Care | Payer: Medicare Other | Attending: Emergency Medicine | Admitting: Emergency Medicine

## 2022-09-30 DIAGNOSIS — Z79899 Other long term (current) drug therapy: Secondary | ICD-10-CM | POA: Insufficient documentation

## 2022-09-30 DIAGNOSIS — I10 Essential (primary) hypertension: Secondary | ICD-10-CM | POA: Diagnosis not present

## 2022-09-30 DIAGNOSIS — E039 Hypothyroidism, unspecified: Secondary | ICD-10-CM | POA: Diagnosis not present

## 2022-09-30 DIAGNOSIS — R42 Dizziness and giddiness: Secondary | ICD-10-CM | POA: Insufficient documentation

## 2022-09-30 LAB — COMPREHENSIVE METABOLIC PANEL
ALT: 11 U/L (ref 0–44)
AST: 15 U/L (ref 15–41)
Albumin: 4 g/dL (ref 3.5–5.0)
Alkaline Phosphatase: 82 U/L (ref 38–126)
Anion gap: 8 (ref 5–15)
BUN: 11 mg/dL (ref 8–23)
CO2: 30 mmol/L (ref 22–32)
Calcium: 9.9 mg/dL (ref 8.9–10.3)
Chloride: 102 mmol/L (ref 98–111)
Creatinine, Ser: 0.87 mg/dL (ref 0.44–1.00)
GFR, Estimated: >60 mL/min (ref >60)
Glucose, Bld: 146 mg/dL — ABNORMAL HIGH (ref 70–99)
Potassium: 3.4 mmol/L — ABNORMAL LOW (ref 3.5–5.1)
Sodium: 140 mmol/L (ref 135–145)
Total Bilirubin: 0.7 mg/dL (ref 0.3–1.2)
Total Protein: 8.1 g/dL (ref 6.5–8.1)

## 2022-09-30 LAB — CBC WITH DIFFERENTIAL/PLATELET
Abs Immature Granulocytes: 0.02 10*3/uL (ref 0.00–0.07)
Basophils Absolute: 0 10*3/uL (ref 0.0–0.1)
Basophils Relative: 0 %
Eosinophils Absolute: 0.2 10*3/uL (ref 0.0–0.5)
Eosinophils Relative: 2 %
HCT: 43 % (ref 36.0–46.0)
Hemoglobin: 14.1 g/dL (ref 12.0–15.0)
Immature Granulocytes: 0 %
Lymphocytes Relative: 25 %
Lymphs Abs: 2.3 10*3/uL (ref 0.7–4.0)
MCH: 27.9 pg (ref 26.0–34.0)
MCHC: 32.8 g/dL (ref 30.0–36.0)
MCV: 85.1 fL (ref 80.0–100.0)
Monocytes Absolute: 0.9 10*3/uL (ref 0.1–1.0)
Monocytes Relative: 9 %
Neutro Abs: 5.9 10*3/uL (ref 1.7–7.7)
Neutrophils Relative %: 64 %
Platelets: 215 10*3/uL (ref 150–400)
RBC: 5.05 MIL/uL (ref 3.87–5.11)
RDW: 14.4 % (ref 11.5–15.5)
WBC: 9.3 10*3/uL (ref 4.0–10.5)
nRBC: 0 % (ref 0.0–0.2)

## 2022-09-30 MED ORDER — MECLIZINE HCL 25 MG PO TABS
25.0000 mg | ORAL_TABLET | Freq: Once | ORAL | Status: AC
  Administered 2022-09-30: 25 mg via ORAL
  Filled 2022-09-30: qty 1

## 2022-09-30 MED ORDER — MECLIZINE HCL 25 MG PO TABS: 25.0000 mg | ORAL_TABLET | Freq: Three times a day (TID) | ORAL | 0 refills | Status: AC | PRN

## 2022-09-30 NOTE — ED Triage Notes (Signed)
Patient here POV from Home.  Endorses going to [facility] on Monday and becoming unbalanced and dizzy. Subsided Mostly and then occurred again today.  No Vision Changes. Slight Headache. No Falls.   NAD Noted during Triage. A&Ox4. GCS 15. Ambulatory.

## 2022-09-30 NOTE — ED Provider Notes (Signed)
[facility] Provider Note: Georgena Spurling, MD, FACEP  CSN: [id number] MRN: [id number] ARRIVAL: 09/30/22 at [place]: [id number]   CHIEF COMPLAINT  Dizziness   HISTORY OF PRESENT ILLNESS  09/30/22 11:25 PM Tara Patrick is a 82 y.o. female who had an episode of dizziness (a sense of surrounding spinning) 4 days ago.  She is not sure if it was worse with moving her head.  It resolved spontaneously.  There was no associated nausea or vomiting.  She had a briefer episode earlier today.  It has also resolved without treatment.  She did not fall or injure herself.  She denies visual changes.   Past Medical History:  Diagnosis Date   History of pulmonary embolism    2001-not sure why   Hyperlipidemia    Hypertension    Hypothyroidism    Wears glasses     Past Surgical History:  Procedure Laterality Date   ABDOMINAL HYSTERECTOMY     BREAST EXCISIONAL BIOPSY Left 12/02/2013   high risk lesion   BREAST LUMPECTOMY WITH RADIOACTIVE SEED LOCALIZATION Left 12/02/2013   Procedure: LEFT BREAST LUMPECTOMY WITH RADIOACTIVE SEED LOCALIZATION;  Surgeon: Adin Hector, MD;  Location: [facility];  Service: General;  Laterality: Left;   BUNIONECTOMY     both feet   COLONOSCOPY      Family History  Problem Relation Age of Onset   Colon cancer Neg Hx    Stomach cancer Neg Hx    Rectal cancer Neg Hx    Pancreatic cancer Neg Hx     Social History   Tobacco Use   Smoking status: Never   Smokeless tobacco: Never  Substance Use Topics   Alcohol use: No   Drug use: No    Prior to Admission medications   Medication Sig Start Date End Date Taking? Authorizing Provider  meclizine (ANTIVERT) 25 MG tablet Take 1 tablet (25 mg total) by mouth 3 (three) times daily as needed for dizziness. 09/30/22  Yes Emberlyn Burlison, MD  acetaminophen (TYLENOL) 500 MG tablet Take 1,000 mg by mouth every 6 (six) hours as needed for mild pain, moderate pain or headache.    [provider]  Black Cohosh 175 MG CAPS Take 175 mg by mouth daily.     [provider]  calcium carbonate (OS-CAL - DOSED IN MG OF ELEMENTAL CALCIUM) 1250 MG tablet Take 1 tablet by mouth daily with breakfast.     [provider]  cholecalciferol (VITAMIN D) 1000 UNITS tablet Take 1,000 Units by mouth daily.    [provider]  HYDROcodone-acetaminophen (NORCO/VICODIN) 5-325 MG tablet Take 1-2 tablets by mouth every 6 (six) hours as needed for severe pain. 07/25/15   [name], PA-C  lisinopril-hydrochlorothiazide (PRINZIDE,ZESTORETIC) 10-12.5 MG per tablet Take 1 tablet by mouth daily.  09/14/13   [provider]  pravastatin (PRAVACHOL) 40 MG tablet Take 40 mg by mouth daily.  09/21/13   [provider]  vitamin B-12 (CYANOCOBALAMIN) 1000 MCG tablet Take 1,000 mcg by mouth daily.    [provider]    Allergies Patient has no known allergies.   REVIEW OF SYSTEMS  Negative except as noted here or in the History of Present Illness.   PHYSICAL EXAMINATION  Initial Vital Signs Blood pressure (!) 200/86, pulse 70, temperature 98.2 F (36.8 C), resp. rate 19, height 5' 3.5" (1.613 m), weight 95.3 kg, SpO2 100 %.  Examination General: Well-developed, well-nourished female in no acute distress; appearance  consistent with age of record HENT: normocephalic; atraumatic Eyes: pupils equal, round and reactive to light; extraocular muscles intact; arcus senilis bilaterally; pseudophakia bilaterally; no nystagmus Neck: supple Heart: regular rate and rhythm Lungs: clear to auscultation bilaterally Abdomen: soft; nondistended; nontender; bowel sounds present Extremities: No deformity; full range of motion; pulses normal Neurologic: Awake, alert and oriented; motor function intact in all extremities and symmetric; no facial droop Skin: Warm and dry Psychiatric: Normal mood and affect   RESULTS  Summary of this visit's results, reviewed and  interpreted by myself:   EKG Interpretation  Date/Time:  Friday September 30 2022 18:02:39 EST Ventricular Rate:  72 PR Interval:  134 QRS Duration: 88 QT Interval:  394 QTC Calculation: 431 R Axis:   9 Text Interpretation: Normal sinus rhythm Nonspecific ST abnormality Abnormal ECG No significant change was found Confirmed by Hussein Macdougal, Jenny Reichmann [phone]) on 09/30/2022 11:25:11 PM       Laboratory Studies: Results for orders placed or performed during the hospital encounter of 09/30/22 (from the past 24 hour(s))  Comprehensive metabolic panel     Status: Abnormal   Collection Time: 09/30/22  5:57 PM  Result Value Ref Range   Sodium 140 135 - 145 mmol/L   Potassium 3.4 (L) 3.5 - 5.1 mmol/L   Chloride 102 98 - 111 mmol/L   CO2 30 22 - 32 mmol/L   Glucose, Bld 146 (H) 70 - 99 mg/dL   BUN 11 8 - 23 mg/dL   Creatinine, Ser 0.87 0.44 - 1.00 mg/dL   Calcium 9.9 8.9 - 10.3 mg/dL   Total Protein 8.1 6.5 - 8.1 g/dL   Albumin 4.0 3.5 - 5.0 g/dL   AST 15 15 - 41 U/L   ALT 11 0 - 44 U/L   Alkaline Phosphatase 82 38 - 126 U/L   Total Bilirubin 0.7 0.3 - 1.2 mg/dL   GFR, Estimated >60 >60 mL/min   Anion gap 8 5 - 15  CBC with Differential     Status: None   Collection Time: 09/30/22  5:57 PM  Result Value Ref Range   WBC 9.3 4.0 - 10.5 K/uL   RBC 5.05 3.87 - 5.11 MIL/uL   Hemoglobin 14.1 12.0 - 15.0 g/dL   HCT 43.0 36.0 - 46.0 %   MCV 85.1 80.0 - 100.0 fL   MCH 27.9 26.0 - 34.0 pg   MCHC 32.8 30.0 - 36.0 g/dL   RDW 14.4 11.5 - 15.5 %   Platelets 215 150 - 400 K/uL   nRBC 0.0 0.0 - 0.2 %   Neutrophils Relative % 64 %   Neutro Abs 5.9 1.7 - 7.7 K/uL   Lymphocytes Relative 25 %   Lymphs Abs 2.3 0.7 - 4.0 K/uL   Monocytes Relative 9 %   Monocytes Absolute 0.9 0.1 - 1.0 K/uL   Eosinophils Relative 2 %   Eosinophils Absolute 0.2 0.0 - 0.5 K/uL   Basophils Relative 0 %   Basophils Absolute 0.0 0.0 - 0.1 K/uL   Immature Granulocytes 0 %   Abs Immature Granulocytes 0.02 0.00 - 0.07 K/uL    Imaging Studies: CT Head Wo Contrast  Result Date: 09/30/2022 CLINICAL DATA:  Dizziness.  Syncope. EXAM: CT HEAD WITHOUT CONTRAST TECHNIQUE: Contiguous axial images were obtained from the base of the skull through the vertex without intravenous contrast. RADIATION DOSE REDUCTION: This exam was performed according to the departmental dose-optimization program which includes automated exposure control, adjustment of the mA and/or kV according to patient  size and/or use of iterative reconstruction technique. COMPARISON:  None Available. FINDINGS: Brain: There is no evidence for acute hemorrhage, hydrocephalus, mass lesion, or abnormal extra-axial fluid collection. No definite CT evidence for acute infarction. Vascular: No hyperdense vessel or unexpected calcification. Skull: No evidence for fracture. No worrisome lytic or sclerotic lesion. Sinuses/Orbits: The visualized paranasal sinuses and mastoid air cells are clear. Visualized portions of the globes and intraorbital fat are unremarkable. Other: None. IMPRESSION: No acute intracranial abnormality. Electronically Signed   By: Misty Stanley M.D.   On: 09/30/2022 18:53    ED COURSE and MDM  Nursing notes, initial and subsequent vitals signs, including pulse oximetry, reviewed and interpreted by myself.  Vitals:   09/30/22 1751 09/30/22 2209 09/30/22 2215 09/30/22 2230  BP:  (!) 201/77 (!) 192/69 (!) 200/86  Pulse:  66 65 70  Resp:  (!) '21 15 19  '$ Temp:      SpO2:  98% 100% 100%  Weight: 95.3 kg     Height: 5' 3.5" (1.613 m)      Medications  meclizine (ANTIVERT) tablet 25 mg (has no administration in time range)    Symptomatology is consistent with peripheral vertigo.  Central vertigo would not be expected to be episodic but is usually persistent.  We will treat with meclizine and refer to her PCP should symptoms recur.  No evidence of intracranial injury on CT scan.  PROCEDURES  Procedures   ED DIAGNOSES     ICD-10-CM   1. Vertigo   R42          Jourdin Connors, MD 09/30/22 [phone]

## 2022-09-30 NOTE — ED Notes (Signed)
Informed RN of BP elevation.

## 2022-09-30 NOTE — ED Provider Triage Note (Signed)
Emergency Medicine Provider Triage Evaluation Note  Tara Patrick , a 82 y.o. female  was evaluated in triage.  Pt complains of dizziness. She states Monday she went to the mailbox and got dizzy, having to hold on to the mailbox to not fall over. She states she felt mostly okay all week until today when the symptoms returned. She has some difficulty describing the symptoms. She denies a room spinning sensation or feeling that she herself is spinning. She denies a lightheaded feeling like she is going to pass out. She does endorse mild headache but denies changes to her vision or vomiting, fever, neck pain. She denies chest pain, palpitations, shortness of breath. She denies head trauma.   Review of Systems  Positive: See above Negative:   Physical Exam  BP (!) 157/68   Pulse 70   Temp 98.2 F (36.8 C)   Resp 14   SpO2 100%  Gen:   Awake, no distress   Resp:  Normal effort  MSK:   Moves extremities without difficulty  Other:  Nonfocal neuro exam   Medical Decision Making  Medically screening exam initiated at 5:51 PM.  Appropriate orders placed.  Tara Patrick was informed that the remainder of the evaluation will be completed by another provider, this initial triage assessment does not replace that evaluation, and the importance of remaining in the ED until their evaluation is complete.     Mickie Hillier, PA-C 09/30/22 1756

## 2022-09-30 NOTE — ED Notes (Signed)
CBG=159  

## 2023-01-30 ENCOUNTER — Ambulatory Visit: Payer: Medicare Other | Admitting: Plastic Surgery

## 2023-01-30 ENCOUNTER — Encounter: Payer: Self-pay | Admitting: Plastic Surgery

## 2023-01-30 VITALS — BP 145/74 | HR 103 | Ht 63.5 in | Wt 220.6 lb

## 2023-01-30 DIAGNOSIS — M952 Other acquired deformity of head: Secondary | ICD-10-CM | POA: Diagnosis not present

## 2023-01-30 DIAGNOSIS — Q178 Other specified congenital malformations of ear: Secondary | ICD-10-CM

## 2023-01-30 NOTE — Progress Notes (Signed)
Referring Provider Geoffry Paradise, MD [address] [place],  [place] [address]   CC:  Chief Complaint  Patient presents with   Advice Only      Tara Patrick is an 82 y.o. female.  HPI: Tara Patrick is a 82 year old female who presents today for evaluation of the right earlobe.  She states that her hearing through and she would like to have it fixed.  No Known Allergies  Outpatient Encounter Medications as of 01/30/2023  Medication Sig Note   acetaminophen (TYLENOL) 500 MG tablet Take 1,000 mg by mouth every 6 (six) hours as needed for mild pain, moderate pain or headache.    Black Cohosh 175 MG CAPS Take 175 mg by mouth daily.     calcium carbonate (OS-CAL - DOSED IN MG OF ELEMENTAL CALCIUM) 1250 MG tablet Take 1 tablet by mouth daily with breakfast.     cholecalciferol (VITAMIN D) 1000 UNITS tablet Take 1,000 Units by mouth daily.    HYDROcodone-acetaminophen (NORCO/VICODIN) 5-325 MG tablet Take 1-2 tablets by mouth every 6 (six) hours as needed for severe pain.    lisinopril-hydrochlorothiazide (PRINZIDE,ZESTORETIC) 10-12.5 MG per tablet Take 1 tablet by mouth daily.     meclizine (ANTIVERT) 25 MG tablet Take 1 tablet (25 mg total) by mouth 3 (three) times daily as needed for dizziness.    metFORMIN (GLUCOPHAGE-XR) 500 MG 24 hr tablet Take 500 mg by mouth 2 (two) times daily. 01/30/2023: Take for 90 days.   pravastatin (PRAVACHOL) 40 MG tablet Take 40 mg by mouth daily.     vitamin B-12 (CYANOCOBALAMIN) 1000 MCG tablet Take 1,000 mcg by mouth daily.    No facility-administered encounter medications on file as of 01/30/2023.     Past Medical History:  Diagnosis Date   History of pulmonary embolism    2001-not sure why   Hyperlipidemia    Hypertension    Hypothyroidism    Wears glasses     Past Surgical History:  Procedure Laterality Date   ABDOMINAL HYSTERECTOMY     BREAST EXCISIONAL BIOPSY Left 12/02/2013   high risk lesion   BREAST LUMPECTOMY WITH RADIOACTIVE SEED  LOCALIZATION Left 12/02/2013   Procedure: LEFT BREAST LUMPECTOMY WITH RADIOACTIVE SEED LOCALIZATION;  Surgeon: Ernestene Mention, MD;  Location: [facility];  Service: General;  Laterality: Left;   BUNIONECTOMY     both feet   COLONOSCOPY      Family History  Problem Relation Age of Onset   Colon cancer Neg Hx    Stomach cancer Neg Hx    Rectal cancer Neg Hx    Pancreatic cancer Neg Hx     Social History   Social History Narrative   Not on file     Review of Systems General: Denies fevers, chills, weight loss CV: Denies chest pain, shortness of breath, palpitations Skin: Acquired defect right earlobe  Physical Exam    01/30/2023    3:41 PM 09/30/2022   11:45 PM 09/30/2022   11:00 PM  Vitals with BMI  Height 5' 3.5"    Weight 220 lbs 10 oz    BMI 38.46    Systolic 145 173 604  Diastolic 74 72 82  Pulse 103 72 48    General:  No acute distress,  Alert and oriented, Non-Toxic, Normal speech and affect Skin: Patient has a well epithelialized torn earlobe on the right. Mammogram: July 2022 BI-RADS 1 Assessment/Plan Torn right earlobe: Discussed repair of the earlobe with the patient.  This can easily be done in the office under local anesthesia.  Will schedule at her request.  Santiago Glad 01/30/2023, 5:03 PM

## 2023-03-09 ENCOUNTER — Ambulatory Visit: Payer: Medicare Other | Admitting: Plastic Surgery

## 2023-03-09 DIAGNOSIS — Q178 Other specified congenital malformations of ear: Secondary | ICD-10-CM

## 2023-03-09 DIAGNOSIS — M952 Other acquired deformity of head: Secondary | ICD-10-CM

## 2023-03-09 NOTE — Progress Notes (Signed)
Procedure Note  Preoperative Dx: Torn right earlobe  Postoperative Dx: Same  Procedure: Repair of right earlobe  Anesthesia: Lidocaine 1% with 1:100,000 epinephrine and 0.25% Sensorcaine   Indication for Procedure: Restore continuity to earlobe  Description of Procedure: Risks and complications were explained to the patient including the possibility of free tearing if she pierces the ear in the same place.  Consent was confirmed and the patient understands the risks and benefits.  The potential complications and alternatives were explained and the patient consents.  The patient expressed understanding the option of not having the procedure and the risks of a scar.  Time out was called and all information was confirmed to be correct.    The area was prepped and drapped.  Local anesthetic was injected in the subcutaneous tissues.  After waiting for the local to take affect the epithelialized edges of the defect were excised sharply.  The subcutaneous tissues of the earlobe were reapproximated with a single 5-0 Monocryl suture.  The skin edges were approximated with interrupted 5-0 Prolene sutures.  The surgical wound measured 1 cm.  A dressing was applied.  The patient was given instructions on how to care for the area and a follow up appointment.  Tara Patrick tolerated the procedure well and there were no complications.

## 2023-03-14 ENCOUNTER — Ambulatory Visit (INDEPENDENT_AMBULATORY_CARE_PROVIDER_SITE_OTHER): Payer: Medicare Other | Admitting: Physician Assistant

## 2023-03-14 DIAGNOSIS — Q178 Other specified congenital malformations of ear: Secondary | ICD-10-CM

## 2023-03-14 DIAGNOSIS — Z9889 Other specified postprocedural states: Secondary | ICD-10-CM

## 2023-03-14 NOTE — Progress Notes (Signed)
Patient is a pleasant 82 year old female s/p repair of torn right earlobe performed 03/09/2023 by Dr. Ladona Ridgel who returns to clinic for postprocedural follow-up.  Reviewed procedure note and the skin edges were reapproximated with interrupted 5-0 Prolene sutures.  Today, patient is doing well.  No specific complaints.  Feels prepared for suture removal.  On exam, skin edges appear well-approximated.  Removed all of the visible Prolene sutures, total of 4, without complication or difficulty.  No bleeding provoked.  She understands that it is still healing and she will need to be gentle with the lobe until it is fully healed.  She also was advised to wait 4 to 6 months before repiercing the ear.  Recommending Vaseline to the earlobe repair site daily x 7 to 10 days.  No specific follow-up needed.  She can certainly call the clinic should she have any questions or concerns.

## 2023-05-07 ENCOUNTER — Emergency Department (HOSPITAL_COMMUNITY)
Admission: EM | Admit: 2023-05-07 | Discharge: 2023-05-07 | Disposition: A | Discharge status: Home / Self Care | Payer: Medicare Other | Attending: Emergency Medicine | Admitting: Emergency Medicine

## 2023-05-07 ENCOUNTER — Emergency Department (HOSPITAL_COMMUNITY): Payer: Medicare Other

## 2023-05-07 ENCOUNTER — Encounter (HOSPITAL_COMMUNITY): Payer: Self-pay

## 2023-05-07 DIAGNOSIS — M25532 Pain in left wrist: Secondary | ICD-10-CM | POA: Diagnosis not present

## 2023-05-07 DIAGNOSIS — E119 Type 2 diabetes mellitus without complications: Secondary | ICD-10-CM | POA: Diagnosis not present

## 2023-05-07 DIAGNOSIS — M542 Cervicalgia: Secondary | ICD-10-CM | POA: Diagnosis not present

## 2023-05-07 DIAGNOSIS — R911 Solitary pulmonary nodule: Secondary | ICD-10-CM | POA: Insufficient documentation

## 2023-05-07 DIAGNOSIS — I10 Essential (primary) hypertension: Secondary | ICD-10-CM | POA: Diagnosis not present

## 2023-05-07 DIAGNOSIS — M8580 Other specified disorders of bone density and structure, unspecified site: Secondary | ICD-10-CM | POA: Insufficient documentation

## 2023-05-07 DIAGNOSIS — Z79899 Other long term (current) drug therapy: Secondary | ICD-10-CM | POA: Insufficient documentation

## 2023-05-07 DIAGNOSIS — D72829 Elevated white blood cell count, unspecified: Secondary | ICD-10-CM | POA: Insufficient documentation

## 2023-05-07 DIAGNOSIS — Z7984 Long term (current) use of oral hypoglycemic drugs: Secondary | ICD-10-CM | POA: Diagnosis not present

## 2023-05-07 DIAGNOSIS — Y92410 Unspecified street and highway as the place of occurrence of the external cause: Secondary | ICD-10-CM | POA: Insufficient documentation

## 2023-05-07 DIAGNOSIS — I700 Atherosclerosis of aorta: Secondary | ICD-10-CM | POA: Insufficient documentation

## 2023-05-07 DIAGNOSIS — K8020 Calculus of gallbladder without cholecystitis without obstruction: Secondary | ICD-10-CM | POA: Diagnosis not present

## 2023-05-07 DIAGNOSIS — M25531 Pain in right wrist: Secondary | ICD-10-CM | POA: Diagnosis not present

## 2023-05-07 DIAGNOSIS — R0789 Other chest pain: Secondary | ICD-10-CM | POA: Diagnosis present

## 2023-05-07 DIAGNOSIS — K5730 Diverticulosis of large intestine without perforation or abscess without bleeding: Secondary | ICD-10-CM | POA: Insufficient documentation

## 2023-05-07 LAB — COMPREHENSIVE METABOLIC PANEL
ALT: 19 U/L (ref 0–44)
AST: 25 U/L (ref 15–41)
Albumin: 3.1 g/dL — ABNORMAL LOW (ref 3.5–5.0)
Alkaline Phosphatase: 87 U/L (ref 38–126)
Anion gap: 12 (ref 5–15)
BUN: 7 mg/dL — ABNORMAL LOW (ref 8–23)
CO2: 28 mmol/L (ref 22–32)
Calcium: 9.2 mg/dL (ref 8.9–10.3)
Chloride: 97 mmol/L — ABNORMAL LOW (ref 98–111)
Creatinine, Ser: 1.07 mg/dL — ABNORMAL HIGH (ref 0.44–1.00)
GFR, Estimated: 53 mL/min — ABNORMAL LOW (ref >60)
Glucose, Bld: 315 mg/dL — ABNORMAL HIGH (ref 70–99)
Potassium: 3.5 mmol/L (ref 3.5–5.1)
Sodium: 137 mmol/L (ref 135–145)
Total Bilirubin: 1.1 mg/dL (ref 0.3–1.2)
Total Protein: 7.4 g/dL (ref 6.5–8.1)

## 2023-05-07 LAB — CBC WITH DIFFERENTIAL/PLATELET
Abs Immature Granulocytes: 0.04 10*3/uL (ref 0.00–0.07)
Basophils Absolute: 0.1 10*3/uL (ref 0.0–0.1)
Basophils Relative: 0 %
Eosinophils Absolute: 0.1 10*3/uL (ref 0.0–0.5)
Eosinophils Relative: 1 %
HCT: 40 % (ref 36.0–46.0)
Hemoglobin: 12.9 g/dL (ref 12.0–15.0)
Immature Granulocytes: 0 %
Lymphocytes Relative: 18 %
Lymphs Abs: 2.3 10*3/uL (ref 0.7–4.0)
MCH: 27 pg (ref 26.0–34.0)
MCHC: 32.3 g/dL (ref 30.0–36.0)
MCV: 83.9 fL (ref 80.0–100.0)
Monocytes Absolute: 0.9 10*3/uL (ref 0.1–1.0)
Monocytes Relative: 7 %
Neutro Abs: 9.1 10*3/uL — ABNORMAL HIGH (ref 1.7–7.7)
Neutrophils Relative %: 74 %
Platelets: 204 10*3/uL (ref 150–400)
RBC: 4.77 MIL/uL (ref 3.87–5.11)
RDW: 13.2 % (ref 11.5–15.5)
WBC: 12.4 10*3/uL — ABNORMAL HIGH (ref 4.0–10.5)
nRBC: 0 % (ref 0.0–0.2)

## 2023-05-07 MED ORDER — ONDANSETRON HCL 4 MG/2ML IJ SOLN
4.0000 mg | Freq: Once | INTRAMUSCULAR | Status: AC
  Administered 2023-05-07: 4 mg via INTRAVENOUS
  Filled 2023-05-07: qty 2

## 2023-05-07 MED ORDER — IOHEXOL 350 MG/ML SOLN
75.0000 mL | Freq: Once | INTRAVENOUS | Status: AC | PRN
  Administered 2023-05-07: 75 mL via INTRAVENOUS

## 2023-05-07 MED ORDER — MORPHINE SULFATE (PF) 4 MG/ML IV SOLN
4.0000 mg | Freq: Once | INTRAVENOUS | Status: AC
  Administered 2023-05-07: 4 mg via INTRAVENOUS
  Filled 2023-05-07: qty 1
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
## 2023-05-07 NOTE — Discharge Instructions (Signed)
Is was pleasure taking care of you here in the emergency department  As discussed in the room you did not have any traumatic injuries however you do have a pulmonary nodule.  As we discussed should follow-up with your primary care provider to have repeat evaluation of this  Recommend Tylenol Motrin as needed at home.  Ice and rest.  Is difficult to have pain worse on days 2 and day 3.  Return for new worsening symptoms such as severe headache, severe neck pain, inability to tolerate oral intake, numbness, weakness
